# Patient Record
Sex: Female | Born: 1950 | Race: Black or African American | Hispanic: No | Marital: Married
Health system: Southern US, Community
[De-identification: ages and names within clinical notes are randomized; demographics above are authoritative.]

---

## 2004-02-10 ENCOUNTER — Other Ambulatory Visit: Payer: Self-pay

## 2006-10-08 ENCOUNTER — Other Ambulatory Visit: Payer: Self-pay

## 2006-10-08 ENCOUNTER — Emergency Department: Payer: Self-pay | Admitting: Emergency Medicine

## 2006-10-16 ENCOUNTER — Ambulatory Visit: Payer: Self-pay | Admitting: Family Medicine

## 2008-11-15 ENCOUNTER — Emergency Department: Payer: Self-pay | Admitting: Emergency Medicine

## 2009-05-03 ENCOUNTER — Ambulatory Visit: Payer: Self-pay | Admitting: Family Medicine

## 2009-06-02 ENCOUNTER — Ambulatory Visit: Payer: Self-pay | Admitting: General Surgery

## 2011-07-26 ENCOUNTER — Ambulatory Visit: Payer: Self-pay | Admitting: Internal Medicine

## 2011-08-09 ENCOUNTER — Emergency Department: Payer: Self-pay | Admitting: Emergency Medicine

## 2011-08-17 ENCOUNTER — Ambulatory Visit: Payer: Self-pay | Admitting: Family Medicine

## 2011-08-21 ENCOUNTER — Ambulatory Visit: Payer: Self-pay | Admitting: Internal Medicine

## 2011-08-24 ENCOUNTER — Ambulatory Visit: Payer: Self-pay | Admitting: Internal Medicine

## 2011-08-26 ENCOUNTER — Ambulatory Visit: Payer: Self-pay | Admitting: Internal Medicine

## 2011-08-30 ENCOUNTER — Ambulatory Visit: Payer: Self-pay | Admitting: Vascular Surgery

## 2011-09-25 ENCOUNTER — Ambulatory Visit: Payer: Self-pay | Admitting: Internal Medicine

## 2011-10-13 LAB — CANCER ANTIGEN 19-9: CA 19-9: 160 U/mL — ABNORMAL HIGH (ref 0–35)

## 2011-10-26 ENCOUNTER — Ambulatory Visit: Payer: Self-pay | Admitting: Internal Medicine

## 2011-10-27 LAB — CANCER ANTIGEN 19-9: CA 19-9: 145 U/mL — ABNORMAL HIGH (ref 0–35)

## 2011-11-11 LAB — CANCER ANTIGEN 19-9: CA 19-9: 120 U/mL — ABNORMAL HIGH (ref 0–35)

## 2011-11-25 ENCOUNTER — Ambulatory Visit: Payer: Self-pay | Admitting: Internal Medicine

## 2011-12-08 ENCOUNTER — Inpatient Hospital Stay: Payer: Self-pay | Admitting: Internal Medicine

## 2011-12-26 ENCOUNTER — Ambulatory Visit: Payer: Self-pay | Admitting: Internal Medicine

## 2012-01-09 LAB — COMPREHENSIVE METABOLIC PANEL
Alkaline Phosphatase: 78 U/L (ref 50–136)
Anion Gap: 5 — ABNORMAL LOW (ref 7–16)
Bilirubin,Total: 0.5 mg/dL (ref 0.2–1.0)
Chloride: 102 mmol/L (ref 98–107)
Co2: 32 mmol/L (ref 21–32)
Creatinine: 0.72 mg/dL (ref 0.60–1.30)
EGFR (African American): >60
EGFR (Non-African Amer.): >60
Osmolality: 274 (ref 275–301)
Potassium: 3.6 mmol/L (ref 3.5–5.1)
Sodium: 139 mmol/L (ref 136–145)

## 2012-01-09 LAB — CBC CANCER CENTER
Basophil #: 0.1 x10 3/mm (ref 0.0–0.1)
Basophil %: 0.5 %
Eosinophil #: 0.7 x10 3/mm (ref 0.0–0.7)
Eosinophil %: 6.2 %
HGB: 10.8 g/dL — ABNORMAL LOW (ref 12.0–16.0)
Lymphocyte %: 16.5 %
MCH: 29.4 pg (ref 26.0–34.0)
MCV: 92 fL (ref 80–100)
Monocyte #: 1.4 x10 3/mm — ABNORMAL HIGH (ref 0.0–0.7)
Monocyte %: 13.1 %
Neutrophil #: 6.8 x10 3/mm — ABNORMAL HIGH (ref 1.4–6.5)
Neutrophil %: 63.7 %
RBC: 3.66 10*6/uL — ABNORMAL LOW (ref 3.80–5.20)

## 2012-01-23 LAB — CBC CANCER CENTER
Basophil #: 0 x10 3/mm (ref 0.0–0.1)
Basophil %: 0.3 %
Eosinophil #: 0.2 x10 3/mm (ref 0.0–0.7)
HCT: 30.5 % — ABNORMAL LOW (ref 35.0–47.0)
HGB: 10.3 g/dL — ABNORMAL LOW (ref 12.0–16.0)
Lymphocyte #: 2.1 x10 3/mm (ref 1.0–3.6)
Lymphocyte %: 19.1 %
MCH: 29.8 pg (ref 26.0–34.0)
MCHC: 33.8 g/dL (ref 32.0–36.0)
MCV: 88 fL (ref 80–100)
Monocyte %: 6 %
Neutrophil #: 8.1 x10 3/mm — ABNORMAL HIGH (ref 1.4–6.5)
RBC: 3.46 10*6/uL — ABNORMAL LOW (ref 3.80–5.20)
RDW: 15.7 % — ABNORMAL HIGH (ref 11.5–14.5)
WBC: 11.1 x10 3/mm — ABNORMAL HIGH (ref 3.6–11.0)

## 2012-01-23 LAB — BASIC METABOLIC PANEL
Anion Gap: 9 (ref 7–16)
BUN: 5 mg/dL — ABNORMAL LOW (ref 7–18)
Calcium, Total: 8.8 mg/dL (ref 8.5–10.1)
Co2: 30 mmol/L (ref 21–32)
EGFR (Non-African Amer.): >60
Glucose: 104 mg/dL — ABNORMAL HIGH (ref 65–99)
Osmolality: 279 (ref 275–301)
Potassium: 2.4 mmol/L — CL (ref 3.5–5.1)
Sodium: 141 mmol/L (ref 136–145)

## 2012-01-23 LAB — HEPATIC FUNCTION PANEL A (ARMC)
Albumin: 2.9 g/dL — ABNORMAL LOW (ref 3.4–5.0)
Alkaline Phosphatase: 111 U/L (ref 50–136)
Bilirubin, Direct: 0.1 mg/dL (ref 0.00–0.20)
SGOT(AST): 24 U/L (ref 15–37)
Total Protein: 6.8 g/dL (ref 6.4–8.2)

## 2012-01-26 ENCOUNTER — Ambulatory Visit: Payer: Self-pay | Admitting: Internal Medicine

## 2012-01-30 LAB — CBC CANCER CENTER
Basophil #: 0 x10 3/mm (ref 0.0–0.1)
Basophil %: 0.4 %
Eosinophil #: 0 x10 3/mm (ref 0.0–0.7)
Eosinophil %: 0.7 %
HCT: 29.8 % — ABNORMAL LOW (ref 35.0–47.0)
HGB: 10 g/dL — ABNORMAL LOW (ref 12.0–16.0)
Lymphocyte #: 1.6 x10 3/mm (ref 1.0–3.6)
MCV: 88 fL (ref 80–100)
Monocyte #: 0.1 x10 3/mm (ref 0.0–0.7)
Neutrophil #: 4.8 x10 3/mm (ref 1.4–6.5)
Platelet: 148 x10 3/mm — ABNORMAL LOW (ref 150–440)
RBC: 3.4 10*6/uL — ABNORMAL LOW (ref 3.80–5.20)
WBC: 6.6 x10 3/mm (ref 3.6–11.0)

## 2012-02-06 LAB — COMPREHENSIVE METABOLIC PANEL
Albumin: 2.9 g/dL — ABNORMAL LOW (ref 3.4–5.0)
Alkaline Phosphatase: 97 U/L (ref 50–136)
BUN: 5 mg/dL — ABNORMAL LOW (ref 7–18)
Bilirubin,Total: 0.3 mg/dL (ref 0.2–1.0)
Co2: 29 mmol/L (ref 21–32)
Creatinine: 0.57 mg/dL — ABNORMAL LOW (ref 0.60–1.30)
EGFR (African American): >60
EGFR (Non-African Amer.): >60
Glucose: 124 mg/dL — ABNORMAL HIGH (ref 65–99)
SGOT(AST): 38 U/L — ABNORMAL HIGH (ref 15–37)
SGPT (ALT): 30 U/L
Sodium: 142 mmol/L (ref 136–145)
Total Protein: 6.6 g/dL (ref 6.4–8.2)

## 2012-02-06 LAB — CBC CANCER CENTER
Basophil #: 0 x10 3/mm (ref 0.0–0.1)
Basophil %: 0.3 %
Eosinophil %: 1.2 %
HCT: 30.2 % — ABNORMAL LOW (ref 35.0–47.0)
Lymphocyte #: 1.6 x10 3/mm (ref 1.0–3.6)
Lymphocyte %: 13.6 %
MCHC: 33.5 g/dL (ref 32.0–36.0)
MCV: 88 fL (ref 80–100)
Monocyte #: 0.7 x10 3/mm (ref 0.0–0.7)
Monocyte %: 5.7 %
Neutrophil #: 9.2 x10 3/mm — ABNORMAL HIGH (ref 1.4–6.5)
Platelet: 209 x10 3/mm (ref 150–440)
RBC: 3.42 10*6/uL — ABNORMAL LOW (ref 3.80–5.20)
RDW: 16.5 % — ABNORMAL HIGH (ref 11.5–14.5)

## 2012-02-13 LAB — CBC CANCER CENTER
Basophil %: 0.1 %
Eosinophil %: 0.7 %
HCT: 30.1 % — ABNORMAL LOW (ref 35.0–47.0)
Lymphocyte #: 1.4 x10 3/mm (ref 1.0–3.6)
MCHC: 33 g/dL (ref 32.0–36.0)
Monocyte #: 0 x10 3/mm (ref 0.0–0.7)
Platelet: 89 x10 3/mm — ABNORMAL LOW (ref 150–440)
RBC: 3.41 10*6/uL — ABNORMAL LOW (ref 3.80–5.20)
RDW: 16.4 % — ABNORMAL HIGH (ref 11.5–14.5)
WBC: 6.6 x10 3/mm (ref 3.6–11.0)

## 2012-02-13 LAB — BASIC METABOLIC PANEL
Anion Gap: 11 (ref 7–16)
Calcium, Total: 8.7 mg/dL (ref 8.5–10.1)
Co2: 27 mmol/L (ref 21–32)
Creatinine: 0.64 mg/dL (ref 0.60–1.30)
EGFR (African American): >60
EGFR (Non-African Amer.): >60
Glucose: 135 mg/dL — ABNORMAL HIGH (ref 65–99)
Osmolality: 277 (ref 275–301)
Sodium: 139 mmol/L (ref 136–145)

## 2012-02-21 LAB — CBC CANCER CENTER
Basophil #: 0 x10 3/mm (ref 0.0–0.1)
Basophil %: 0.2 %
Eosinophil #: 0.2 x10 3/mm (ref 0.0–0.7)
HCT: 29.6 % — ABNORMAL LOW (ref 35.0–47.0)
HGB: 9.9 g/dL — ABNORMAL LOW (ref 12.0–16.0)
Lymphocyte #: 2.1 x10 3/mm (ref 1.0–3.6)
Lymphocyte %: 13 %
MCH: 29.3 pg (ref 26.0–34.0)
MCHC: 33.4 g/dL (ref 32.0–36.0)
Monocyte #: 1.2 x10 3/mm — ABNORMAL HIGH (ref 0.0–0.7)
Neutrophil #: 12.8 x10 3/mm — ABNORMAL HIGH (ref 1.4–6.5)
RDW: 19 % — ABNORMAL HIGH (ref 11.5–14.5)

## 2012-02-21 LAB — COMPREHENSIVE METABOLIC PANEL
Albumin: 3 g/dL — ABNORMAL LOW (ref 3.4–5.0)
Anion Gap: 11 (ref 7–16)
BUN: 7 mg/dL (ref 7–18)
Calcium, Total: 8.8 mg/dL (ref 8.5–10.1)
Glucose: 95 mg/dL (ref 65–99)
Osmolality: 283 (ref 275–301)
SGOT(AST): 57 U/L — ABNORMAL HIGH (ref 15–37)
SGPT (ALT): 41 U/L
Total Protein: 6.6 g/dL (ref 6.4–8.2)

## 2012-02-21 LAB — MAGNESIUM: Magnesium: 1.6 mg/dL — ABNORMAL LOW

## 2012-02-23 ENCOUNTER — Ambulatory Visit: Payer: Self-pay | Admitting: Internal Medicine

## 2012-02-26 LAB — HEPATIC FUNCTION PANEL A (ARMC)
Albumin: 2.9 g/dL — ABNORMAL LOW (ref 3.4–5.0)
Alkaline Phosphatase: 120 U/L (ref 50–136)
Bilirubin, Direct: 0.1 mg/dL (ref 0.00–0.20)
Bilirubin,Total: 0.6 mg/dL (ref 0.2–1.0)

## 2012-02-26 LAB — CBC CANCER CENTER
Basophil #: 0 x10 3/mm (ref 0.0–0.1)
Basophil %: 0.3 %
Eosinophil #: 0.2 x10 3/mm (ref 0.0–0.7)
HCT: 27.9 % — ABNORMAL LOW (ref 35.0–47.0)
Lymphocyte %: 14 %
MCHC: 33.9 g/dL (ref 32.0–36.0)
Monocyte %: 9 %
Neutrophil #: 9.3 x10 3/mm — ABNORMAL HIGH (ref 1.4–6.5)
Neutrophil %: 75.1 %
Platelet: 244 x10 3/mm (ref 150–440)
WBC: 12.4 x10 3/mm — ABNORMAL HIGH (ref 3.6–11.0)

## 2012-02-26 LAB — MAGNESIUM: Magnesium: 1.7 mg/dL — ABNORMAL LOW

## 2012-02-26 LAB — CREATININE, SERUM: EGFR (Non-African Amer.): >60

## 2012-02-27 LAB — CANCER ANTIGEN 19-9: CA 19-9: 670 U/mL — ABNORMAL HIGH (ref 0–35)

## 2012-02-29 LAB — CBC CANCER CENTER
Basophil #: 0.1 x10 3/mm (ref 0.0–0.1)
Eosinophil #: 0.2 x10 3/mm (ref 0.0–0.7)
Eosinophil %: 1.1 %
HCT: 29.7 % — ABNORMAL LOW (ref 35.0–47.0)
Lymphocyte #: 1.7 x10 3/mm (ref 1.0–3.6)
MCV: 89 fL (ref 80–100)
Monocyte #: 1.3 x10 3/mm — ABNORMAL HIGH (ref 0.0–0.7)
Monocyte %: 10.1 %
Platelet: 301 x10 3/mm (ref 150–440)
RDW: 20.4 % — ABNORMAL HIGH (ref 11.5–14.5)
WBC: 13.3 x10 3/mm — ABNORMAL HIGH (ref 3.6–11.0)

## 2012-02-29 LAB — POTASSIUM: Potassium: 3 mmol/L — ABNORMAL LOW (ref 3.5–5.1)

## 2012-02-29 LAB — MAGNESIUM: Magnesium: 1.9 mg/dL

## 2012-02-29 LAB — SGOT (AST)(ARMC): SGOT(AST): 35 U/L (ref 15–37)

## 2012-02-29 LAB — CREATININE, SERUM: Creatinine: 0.77 mg/dL (ref 0.60–1.30)

## 2012-03-04 LAB — CREATININE, SERUM
Creatinine: 0.67 mg/dL (ref 0.60–1.30)
EGFR (African American): >60
EGFR (Non-African Amer.): >60

## 2012-03-11 LAB — COMPREHENSIVE METABOLIC PANEL
Albumin: 2.5 g/dL — ABNORMAL LOW (ref 3.4–5.0)
Alkaline Phosphatase: 166 U/L — ABNORMAL HIGH (ref 50–136)
Anion Gap: 10 (ref 7–16)
BUN: 8 mg/dL (ref 7–18)
Bilirubin,Total: 1.1 mg/dL — ABNORMAL HIGH (ref 0.2–1.0)
Calcium, Total: 9 mg/dL (ref 8.5–10.1)
Chloride: 93 mmol/L — ABNORMAL LOW (ref 98–107)
Creatinine: 0.77 mg/dL (ref 0.60–1.30)
EGFR (African American): >60
Glucose: 120 mg/dL — ABNORMAL HIGH (ref 65–99)
Potassium: 4.2 mmol/L (ref 3.5–5.1)
SGOT(AST): 41 U/L — ABNORMAL HIGH (ref 15–37)
SGPT (ALT): 44 U/L
Total Protein: 7.8 g/dL (ref 6.4–8.2)

## 2012-03-11 LAB — CBC CANCER CENTER
Basophil #: 0 x10 3/mm (ref 0.0–0.1)
Eosinophil #: 0.9 x10 3/mm — ABNORMAL HIGH (ref 0.0–0.7)
Lymphocyte %: 5.8 %
MCH: 29.1 pg (ref 26.0–34.0)
MCHC: 33.4 g/dL (ref 32.0–36.0)
MCV: 87 fL (ref 80–100)
Monocyte #: 3 x10 3/mm — ABNORMAL HIGH (ref 0.0–0.7)
Neutrophil %: 77.1 %
Platelet: 496 x10 3/mm — ABNORMAL HIGH (ref 150–440)
RDW: 18.8 % — ABNORMAL HIGH (ref 11.5–14.5)

## 2012-03-11 LAB — RETICULOCYTES
Absolute Retic Count: 0.071 10*6/uL (ref 0.024–0.084)
Reticulocyte: 2.4 % — ABNORMAL HIGH (ref 0.5–1.5)

## 2012-03-11 LAB — BILIRUBIN, DIRECT: Bilirubin, Direct: 0.4 mg/dL — ABNORMAL HIGH (ref 0.00–0.20)

## 2012-03-13 ENCOUNTER — Inpatient Hospital Stay: Payer: Self-pay | Admitting: Internal Medicine

## 2012-03-13 LAB — CBC CANCER CENTER
Basophil %: 0.2 %
Eosinophil #: 0.5 x10 3/mm (ref 0.0–0.7)
Eosinophil %: 1.6 %
Eosinophil: 2 %
HCT: 24.2 % — ABNORMAL LOW (ref 35.0–47.0)
HGB: 8.2 g/dL — ABNORMAL LOW (ref 12.0–16.0)
Lymphocyte #: 0.8 x10 3/mm — ABNORMAL LOW (ref 1.0–3.6)
MCH: 29.7 pg (ref 26.0–34.0)
MCHC: 33.9 g/dL (ref 32.0–36.0)
MCV: 88 fL (ref 80–100)
Monocyte #: 2.6 x10 3/mm — ABNORMAL HIGH (ref 0.0–0.7)
Neutrophil #: 25 x10 3/mm — ABNORMAL HIGH (ref 1.4–6.5)
RBC: 2.77 10*6/uL — ABNORMAL LOW (ref 3.80–5.20)
Segmented Neutrophils: 78 %

## 2012-03-13 LAB — CREATININE, SERUM
Creatinine: 0.99 mg/dL (ref 0.60–1.30)
EGFR (African American): >60
EGFR (Non-African Amer.): >60

## 2012-03-13 LAB — URINALYSIS, COMPLETE
Bacteria: NONE SEEN
Bilirubin,UR: NEGATIVE
Bilirubin,UR: NEGATIVE
Blood: NEGATIVE
Blood: NEGATIVE
Glucose,UR: NEGATIVE mg/dL (ref 0–75)
Ketone: NEGATIVE
Ketone: NEGATIVE
Nitrite: NEGATIVE
Ph: 5 (ref 4.5–8.0)
Protein: 30
RBC,UR: 1 /HPF (ref 0–5)
Specific Gravity: 1.023 (ref 1.003–1.030)
Squamous Epithelial: 4
WBC UR: 3 /HPF (ref 0–5)
WBC UR: 2 /HPF (ref 0–5)

## 2012-03-13 LAB — LACTATE DEHYDROGENASE: LDH: 186 U/L (ref 84–246)

## 2012-03-13 LAB — RETICULOCYTES: Absolute Retic Count: 0.071 10*6/uL (ref 0.024–0.084)

## 2012-03-13 LAB — HEMOGLOBIN
HGB: 7.8 g/dL — ABNORMAL LOW (ref 12.0–16.0)
HGB: 8.1 g/dL — ABNORMAL LOW (ref 12.0–16.0)

## 2012-03-13 LAB — HEPATIC FUNCTION PANEL A (ARMC): Bilirubin, Direct: 0.5 mg/dL — ABNORMAL HIGH (ref 0.00–0.20)

## 2012-03-14 LAB — CBC WITH DIFFERENTIAL/PLATELET
Basophil #: 0 10*3/uL (ref 0.0–0.1)
Eosinophil #: 0 10*3/uL (ref 0.0–0.7)
Eosinophil %: 0 %
HGB: 7.4 g/dL — ABNORMAL LOW (ref 12.0–16.0)
Lymphocyte #: 1 10*3/uL (ref 1.0–3.6)
MCH: 30.6 pg (ref 26.0–34.0)
Monocyte #: 1 10*3/uL — ABNORMAL HIGH (ref 0.0–0.7)
Neutrophil %: 91.8 %
Platelet: 369 10*3/uL (ref 150–440)
RBC: 2.41 10*6/uL — ABNORMAL LOW (ref 3.80–5.20)

## 2012-03-14 LAB — OCCULT BLOOD X 1 CARD TO LAB, STOOL: Occult Blood, Feces: NEGATIVE

## 2012-03-14 LAB — IRON AND TIBC
Iron Bind.Cap.(Total): 143 ug/dL — ABNORMAL LOW (ref 250–450)
Unbound Iron-Bind.Cap.: 98 ug/dL

## 2012-03-14 LAB — PROTIME-INR: INR: 1.3

## 2012-03-14 LAB — CREATININE, SERUM: EGFR (African American): >60

## 2012-03-14 LAB — BILIRUBIN, TOTAL: Bilirubin,Total: 0.6 mg/dL (ref 0.2–1.0)

## 2012-03-14 LAB — RETICULOCYTES
Absolute Retic Count: 0.066 10*6/uL (ref 0.024–0.084)
Reticulocyte: 2.7 % — ABNORMAL HIGH (ref 0.5–1.5)

## 2012-03-14 LAB — LACTATE DEHYDROGENASE: LDH: 178 U/L (ref 84–246)

## 2012-03-15 LAB — LACTATE DEHYDROGENASE: LDH: 197 U/L (ref 84–246)

## 2012-03-15 LAB — URINE CULTURE

## 2012-03-15 LAB — HEMOGLOBIN: HGB: 7.9 g/dL — ABNORMAL LOW (ref 12.0–16.0)

## 2012-03-15 LAB — RETICULOCYTES: Reticulocyte: 3.6 % — ABNORMAL HIGH (ref 0.5–1.5)

## 2012-03-16 LAB — CBC WITH DIFFERENTIAL/PLATELET
Bands: 3 %
Basophil #: 0 10*3/uL (ref 0.0–0.1)
Eosinophil #: 0.3 10*3/uL (ref 0.0–0.7)
Eosinophil %: 0.7 %
HCT: 23.5 % — ABNORMAL LOW (ref 35.0–47.0)
HGB: 7.9 g/dL — ABNORMAL LOW (ref 12.0–16.0)
Lymphocyte #: 1.1 10*3/uL (ref 1.0–3.6)
Lymphocyte %: 2.8 %
MCHC: 33.7 g/dL (ref 32.0–36.0)
MCV: 87 fL (ref 80–100)
Monocyte #: 3.2 10*3/uL — ABNORMAL HIGH (ref 0.0–0.7)
Neutrophil %: 88.4 %
Platelet: 290 10*3/uL (ref 150–440)
RDW: 18.5 % — ABNORMAL HIGH (ref 11.5–14.5)
Segmented Neutrophils: 86 %
WBC: 40.1 10*3/uL — ABNORMAL HIGH (ref 3.6–11.0)

## 2012-03-16 LAB — RETICULOCYTES
Absolute Retic Count: 0.12 10*6/uL — ABNORMAL HIGH (ref 0.024–0.084)
Reticulocyte: 4.5 % — ABNORMAL HIGH (ref 0.5–1.5)

## 2012-03-16 LAB — LACTATE DEHYDROGENASE: LDH: 197 U/L (ref 84–246)

## 2012-03-17 ENCOUNTER — Inpatient Hospital Stay: Payer: Self-pay | Admitting: Internal Medicine

## 2012-03-17 LAB — URINALYSIS, COMPLETE
Bilirubin,UR: NEGATIVE
Ketone: NEGATIVE
Leukocyte Esterase: NEGATIVE
Nitrite: NEGATIVE
Ph: 5 (ref 4.5–8.0)
Specific Gravity: 1.015 (ref 1.003–1.030)
Squamous Epithelial: 7
WBC UR: 2 /HPF (ref 0–5)

## 2012-03-17 LAB — COMPREHENSIVE METABOLIC PANEL
Albumin: 2.1 g/dL — ABNORMAL LOW (ref 3.4–5.0)
BUN: 7 mg/dL (ref 7–18)
Bilirubin,Total: 0.9 mg/dL (ref 0.2–1.0)
Chloride: 100 mmol/L (ref 98–107)
Co2: 24 mmol/L (ref 21–32)
EGFR (Non-African Amer.): >60
Glucose: 101 mg/dL — ABNORMAL HIGH (ref 65–99)
SGPT (ALT): 27 U/L
Total Protein: 6.5 g/dL (ref 6.4–8.2)

## 2012-03-17 LAB — CBC
HCT: 22.9 % — ABNORMAL LOW (ref 35.0–47.0)
MCHC: 33.1 g/dL (ref 32.0–36.0)
MCV: 88 fL (ref 80–100)
Platelet: 192 10*3/uL (ref 150–440)
RDW: 18.9 % — ABNORMAL HIGH (ref 11.5–14.5)

## 2012-03-17 LAB — LIPASE, BLOOD: Lipase: 50 U/L — ABNORMAL LOW (ref 73–393)

## 2012-03-17 LAB — CK TOTAL AND CKMB (NOT AT ARMC): CK, Total: 11 U/L — ABNORMAL LOW (ref 21–215)

## 2012-03-18 LAB — COMPREHENSIVE METABOLIC PANEL
Albumin: 1.8 g/dL — ABNORMAL LOW (ref 3.4–5.0)
Alkaline Phosphatase: 91 U/L (ref 50–136)
Anion Gap: 12 (ref 7–16)
BUN: 7 mg/dL (ref 7–18)
Calcium, Total: 8 mg/dL — ABNORMAL LOW (ref 8.5–10.1)
Chloride: 101 mmol/L (ref 98–107)
Creatinine: 0.62 mg/dL (ref 0.60–1.30)
Glucose: 95 mg/dL (ref 65–99)
Potassium: 4 mmol/L (ref 3.5–5.1)
SGOT(AST): 24 U/L (ref 15–37)
SGPT (ALT): 22 U/L
Total Protein: 5.6 g/dL — ABNORMAL LOW (ref 6.4–8.2)

## 2012-03-18 LAB — CBC WITH DIFFERENTIAL/PLATELET
Basophil #: 0 10*3/uL (ref 0.0–0.1)
Basophil %: 0 %
Eosinophil #: 0.4 10*3/uL (ref 0.0–0.7)
Eosinophil %: 0.8 %
Eosinophil: 2 %
HCT: 20 % — ABNORMAL LOW (ref 35.0–47.0)
HGB: 6.8 g/dL — ABNORMAL LOW (ref 12.0–16.0)
Lymphocyte #: 1.9 10*3/uL (ref 1.0–3.6)
Lymphocyte %: 4.4 %
MCH: 31.1 pg (ref 26.0–34.0)
MCV: 91 fL (ref 80–100)
Neutrophil #: 39.5 10*3/uL — ABNORMAL HIGH (ref 1.4–6.5)
Neutrophil %: 89.9 %
Platelet: 159 10*3/uL (ref 150–440)
RDW: 19 % — ABNORMAL HIGH (ref 11.5–14.5)
WBC: 43.7 10*3/uL — ABNORMAL HIGH (ref 3.6–11.0)

## 2012-03-18 LAB — CREATININE, SERUM: Creatinine: 0.64 mg/dL (ref 0.60–1.30)

## 2012-03-19 LAB — VANCOMYCIN, TROUGH: Vancomycin, Trough: 15 ug/mL (ref 10–20)

## 2012-03-19 LAB — BASIC METABOLIC PANEL
Calcium, Total: 8.3 mg/dL — ABNORMAL LOW (ref 8.5–10.1)
Chloride: 102 mmol/L (ref 98–107)
Creatinine: 0.57 mg/dL — ABNORMAL LOW (ref 0.60–1.30)
EGFR (Non-African Amer.): >60
Glucose: 110 mg/dL — ABNORMAL HIGH (ref 65–99)
Potassium: 3.2 mmol/L — ABNORMAL LOW (ref 3.5–5.1)
Sodium: 138 mmol/L (ref 136–145)

## 2012-03-19 LAB — CBC WITH DIFFERENTIAL/PLATELET
Basophil %: 0 %
Eosinophil %: 0.1 %
HCT: 24.5 % — ABNORMAL LOW (ref 35.0–47.0)
HGB: 8.3 g/dL — ABNORMAL LOW (ref 12.0–16.0)
Lymphocyte %: 3.3 %
MCV: 89 fL (ref 80–100)
Monocyte %: 5.2 %
Neutrophil #: 41.1 10*3/uL — ABNORMAL HIGH (ref 1.4–6.5)
Neutrophil %: 91.4 %
Platelet: 165 10*3/uL (ref 150–440)
RDW: 17.1 % — ABNORMAL HIGH (ref 11.5–14.5)

## 2012-03-20 LAB — CBC WITH DIFFERENTIAL/PLATELET
Bands: 1 %
Basophil #: 0 10*3/uL (ref 0.0–0.1)
Basophil %: 0 %
Eosinophil %: 0.7 %
Eosinophil: 1 %
HGB: 9.1 g/dL — ABNORMAL LOW (ref 12.0–16.0)
Lymphocyte #: 0.9 10*3/uL — ABNORMAL LOW (ref 1.0–3.6)
Lymphocyte %: 1.8 %
Lymphocytes: 1 %
MCHC: 35 g/dL (ref 32.0–36.0)
Metamyelocyte: 1 %
Monocyte #: 2.6 10*3/uL — ABNORMAL HIGH (ref 0.0–0.7)
Monocyte %: 5.2 %
Monocytes: 4 %
Neutrophil %: 92.3 %
RBC: 2.8 10*6/uL — ABNORMAL LOW (ref 3.80–5.20)
RDW: 17.8 % — ABNORMAL HIGH (ref 11.5–14.5)
Segmented Neutrophils: 92 %

## 2012-03-20 LAB — POTASSIUM: Potassium: 3.7 mmol/L (ref 3.5–5.1)

## 2012-03-20 LAB — CREATININE, SERUM
Creatinine: 0.8 mg/dL (ref 0.60–1.30)
EGFR (Non-African Amer.): >60

## 2012-03-23 LAB — CULTURE, BLOOD (SINGLE)

## 2012-03-25 ENCOUNTER — Ambulatory Visit: Payer: Self-pay | Admitting: Internal Medicine

## 2012-03-25 ENCOUNTER — Inpatient Hospital Stay: Payer: Self-pay | Admitting: Internal Medicine

## 2012-03-25 LAB — COMPREHENSIVE METABOLIC PANEL
Albumin: 2.2 g/dL — ABNORMAL LOW (ref 3.4–5.0)
Alkaline Phosphatase: 115 U/L (ref 50–136)
Anion Gap: 14 (ref 7–16)
Co2: 25 mmol/L (ref 21–32)
Creatinine: 0.83 mg/dL (ref 0.60–1.30)
EGFR (Non-African Amer.): >60
Osmolality: 273 (ref 275–301)
Potassium: 3.5 mmol/L (ref 3.5–5.1)
SGOT(AST): 54 U/L — ABNORMAL HIGH (ref 15–37)
Sodium: 136 mmol/L (ref 136–145)
Total Protein: 6.9 g/dL (ref 6.4–8.2)

## 2012-03-25 LAB — URINALYSIS, COMPLETE
Ketone: NEGATIVE
Leukocyte Esterase: NEGATIVE
Nitrite: NEGATIVE
Protein: 30
RBC,UR: NONE SEEN /HPF (ref 0–5)
Specific Gravity: 1.014 (ref 1.003–1.030)
Squamous Epithelial: <1
WBC UR: 3 /HPF (ref 0–5)

## 2012-03-25 LAB — LIPASE, BLOOD: Lipase: 38 U/L — ABNORMAL LOW (ref 73–393)

## 2012-03-25 LAB — TROPONIN I
Troponin-I: 0.38 ng/mL — ABNORMAL HIGH
Troponin-I: 0.47 ng/mL — ABNORMAL HIGH

## 2012-03-25 LAB — CBC
MCH: 29.6 pg (ref 26.0–34.0)
MCV: 88 fL (ref 80–100)
RDW: 18.1 % — ABNORMAL HIGH (ref 11.5–14.5)
WBC: 72.4 10*3/uL — ABNORMAL HIGH (ref 3.6–11.0)

## 2012-03-25 LAB — CK TOTAL AND CKMB (NOT AT ARMC)
CK, Total: 59 U/L (ref 21–215)
CK-MB: 1 ng/mL (ref 0.5–3.6)

## 2012-03-26 LAB — CK TOTAL AND CKMB (NOT AT ARMC)
CK, Total: 65 U/L (ref 21–215)
CK-MB: 0.8 ng/mL (ref 0.5–3.6)
CK-MB: 0.5 ng/mL (ref 0.5–3.6)

## 2012-03-26 LAB — CBC WITH DIFFERENTIAL/PLATELET
Comment - H1-Com4: NORMAL
MCH: 29.5 pg (ref 26.0–34.0)
Myelocyte: 1 %
RBC: 2.7 10*6/uL — ABNORMAL LOW (ref 3.80–5.20)
RDW: 18.4 % — ABNORMAL HIGH (ref 11.5–14.5)
WBC: 58.6 10*3/uL — ABNORMAL HIGH (ref 3.6–11.0)

## 2012-03-26 LAB — COMPREHENSIVE METABOLIC PANEL
Alkaline Phosphatase: 101 U/L (ref 50–136)
Anion Gap: 11 (ref 7–16)
BUN: 16 mg/dL (ref 7–18)
Bilirubin,Total: 1.1 mg/dL — ABNORMAL HIGH (ref 0.2–1.0)
Calcium, Total: 8.4 mg/dL — ABNORMAL LOW (ref 8.5–10.1)
Chloride: 100 mmol/L (ref 98–107)
EGFR (African American): >60
EGFR (Non-African Amer.): >60
Glucose: 109 mg/dL — ABNORMAL HIGH (ref 65–99)
Potassium: 3.8 mmol/L (ref 3.5–5.1)
SGPT (ALT): 17 U/L

## 2012-03-27 LAB — CBC WITH DIFFERENTIAL/PLATELET
Basophil #: 0 10*3/uL (ref 0.0–0.1)
Basophil %: 0 %
Eosinophil #: 0.1 10*3/uL (ref 0.0–0.7)
HGB: 8.4 g/dL — ABNORMAL LOW (ref 12.0–16.0)
Lymphocyte #: 1.3 10*3/uL (ref 1.0–3.6)
Lymphocyte %: 2 %
MCH: 29.1 pg (ref 26.0–34.0)
MCHC: 33.5 g/dL (ref 32.0–36.0)
MCV: 87 fL (ref 80–100)
Monocyte #: 1.8 10*3/uL — ABNORMAL HIGH (ref 0.0–0.7)
Neutrophil #: 59.8 10*3/uL — ABNORMAL HIGH (ref 1.4–6.5)
Neutrophil %: 95 %
Platelet: 74 10*3/uL — ABNORMAL LOW (ref 150–440)
RBC: 2.9 10*6/uL — ABNORMAL LOW (ref 3.80–5.20)
WBC: 64.4 10*3/uL — ABNORMAL HIGH (ref 3.6–11.0)

## 2012-03-27 LAB — COMPREHENSIVE METABOLIC PANEL
Albumin: 1.7 g/dL — ABNORMAL LOW (ref 3.4–5.0)
Alkaline Phosphatase: 100 U/L (ref 50–136)
Anion Gap: 12 (ref 7–16)
BUN: 16 mg/dL (ref 7–18)
Calcium, Total: 8.9 mg/dL (ref 8.5–10.1)
Chloride: 102 mmol/L (ref 98–107)
Co2: 25 mmol/L (ref 21–32)
Creatinine: 0.79 mg/dL (ref 0.60–1.30)
EGFR (African American): >60
EGFR (Non-African Amer.): >60
Glucose: 81 mg/dL (ref 65–99)
Osmolality: 278 (ref 275–301)
Potassium: 3.2 mmol/L — ABNORMAL LOW (ref 3.5–5.1)
SGOT(AST): 40 U/L — ABNORMAL HIGH (ref 15–37)
SGPT (ALT): 17 U/L
Total Protein: 5.9 g/dL — ABNORMAL LOW (ref 6.4–8.2)

## 2012-03-28 LAB — HEPATIC FUNCTION PANEL A (ARMC)
Albumin: 1.8 g/dL — ABNORMAL LOW (ref 3.4–5.0)
Alkaline Phosphatase: 102 U/L (ref 50–136)
Bilirubin, Direct: 0.5 mg/dL — ABNORMAL HIGH (ref 0.00–0.20)
SGOT(AST): 54 U/L — ABNORMAL HIGH (ref 15–37)
SGPT (ALT): 20 U/L
Total Protein: 6 g/dL — ABNORMAL LOW (ref 6.4–8.2)

## 2012-03-28 LAB — CBC WITH DIFFERENTIAL/PLATELET
Basophil %: 0 %
Eosinophil %: 0.3 %
HCT: 28.9 % — ABNORMAL LOW (ref 35.0–47.0)
Lymphocyte #: 1.5 10*3/uL (ref 1.0–3.6)
Lymphocyte %: 2.5 %
Monocyte #: 1.9 10*3/uL — ABNORMAL HIGH (ref 0.0–0.7)
Monocyte %: 3.1 %
RBC: 3.27 10*6/uL — ABNORMAL LOW (ref 3.80–5.20)

## 2012-03-28 LAB — CREATININE, SERUM: EGFR (African American): >60

## 2012-03-29 LAB — BASIC METABOLIC PANEL
BUN: 22 mg/dL — ABNORMAL HIGH (ref 7–18)
Chloride: 104 mmol/L (ref 98–107)
Co2: 26 mmol/L (ref 21–32)
Creatinine: 0.88 mg/dL (ref 0.60–1.30)
EGFR (African American): >60
EGFR (Non-African Amer.): >60
Glucose: 95 mg/dL (ref 65–99)
Osmolality: 283 (ref 275–301)
Potassium: 3.1 mmol/L — ABNORMAL LOW (ref 3.5–5.1)
Sodium: 140 mmol/L (ref 136–145)

## 2012-03-29 LAB — CBC WITH DIFFERENTIAL/PLATELET
Bands: 16 %
HCT: 29.8 % — ABNORMAL LOW (ref 35.0–47.0)
HGB: 10 g/dL — ABNORMAL LOW (ref 12.0–16.0)
MCH: 29.2 pg (ref 26.0–34.0)
MCV: 88 fL (ref 80–100)
Platelet: 27 10*3/uL — CL (ref 150–440)
Segmented Neutrophils: 80 %

## 2012-03-31 LAB — CULTURE, BLOOD (SINGLE)

## 2012-04-24 ENCOUNTER — Ambulatory Visit: Payer: Self-pay | Admitting: Internal Medicine

## 2012-04-24 DEATH — deceased

## 2013-02-27 IMAGING — CR DG CHEST 1V PORT
1 series · 1 of 1 positions shown · non-contrast
Comparison: none

REASON FOR EXAM: shortness of breath
COMMENTS:

[portable]
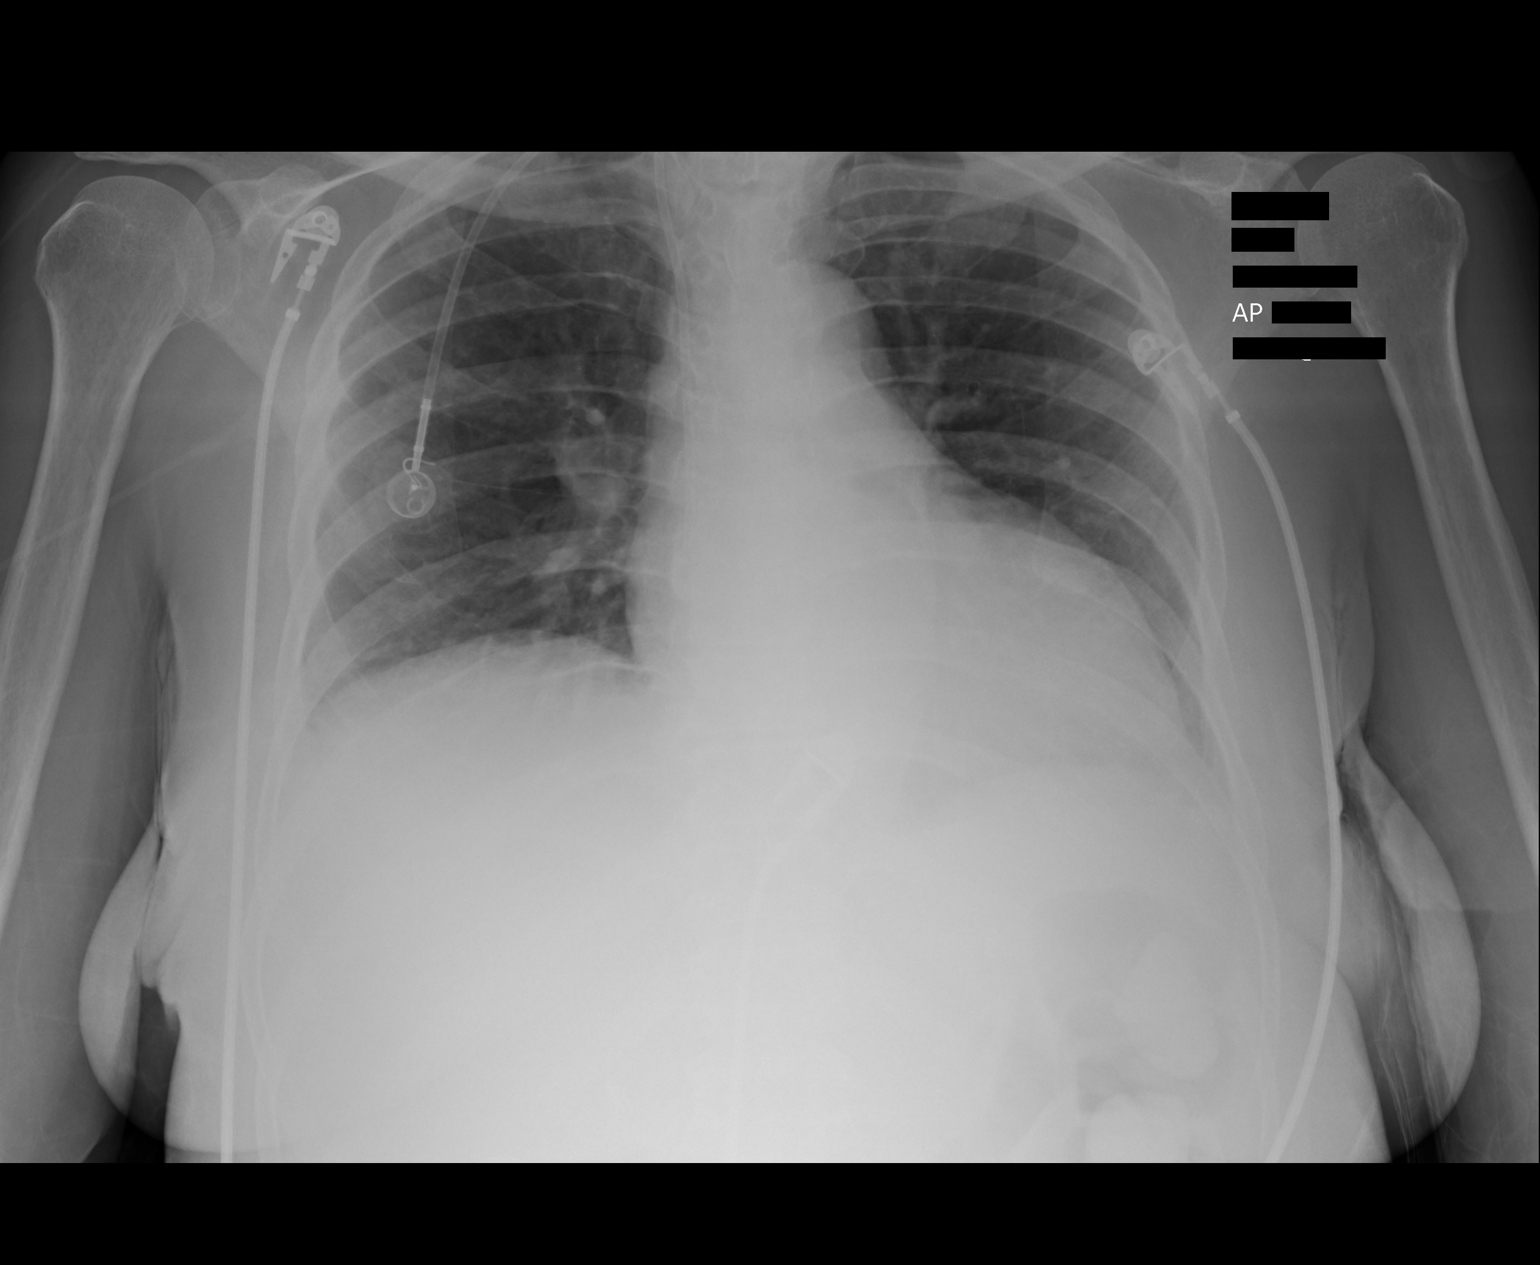

[1 of 1 positions shown; findings below may reference images not displayed]

PROCEDURE:     DXR - DXR PORTABLE CHEST SINGLE VIEW  - March 17, 2012  [DATE]

RESULT:     Portable AP view was obtained. The lung fields are clear of
infiltrate. No lung mass is seen. A Port-A-Cath is present. The heart is
upper limits for normal in size or mildly enlarged. Monitoring electrodes
are present.
IMPRESSION: 1. No acute changes are identified.
2. The heart is upper limits for normal in size or slightly enlarged.
3. A Port-A-Cath is present.

## 2013-02-28 IMAGING — CR DG CHEST 2V
1 series · 2 of 2 positions shown · non-contrast
Comparison: none

REASON FOR EXAM: fever
COMMENTS:

[Series 1: pa · 0.17mm/px · 2 of 2 slices shown]
[im 1/2]
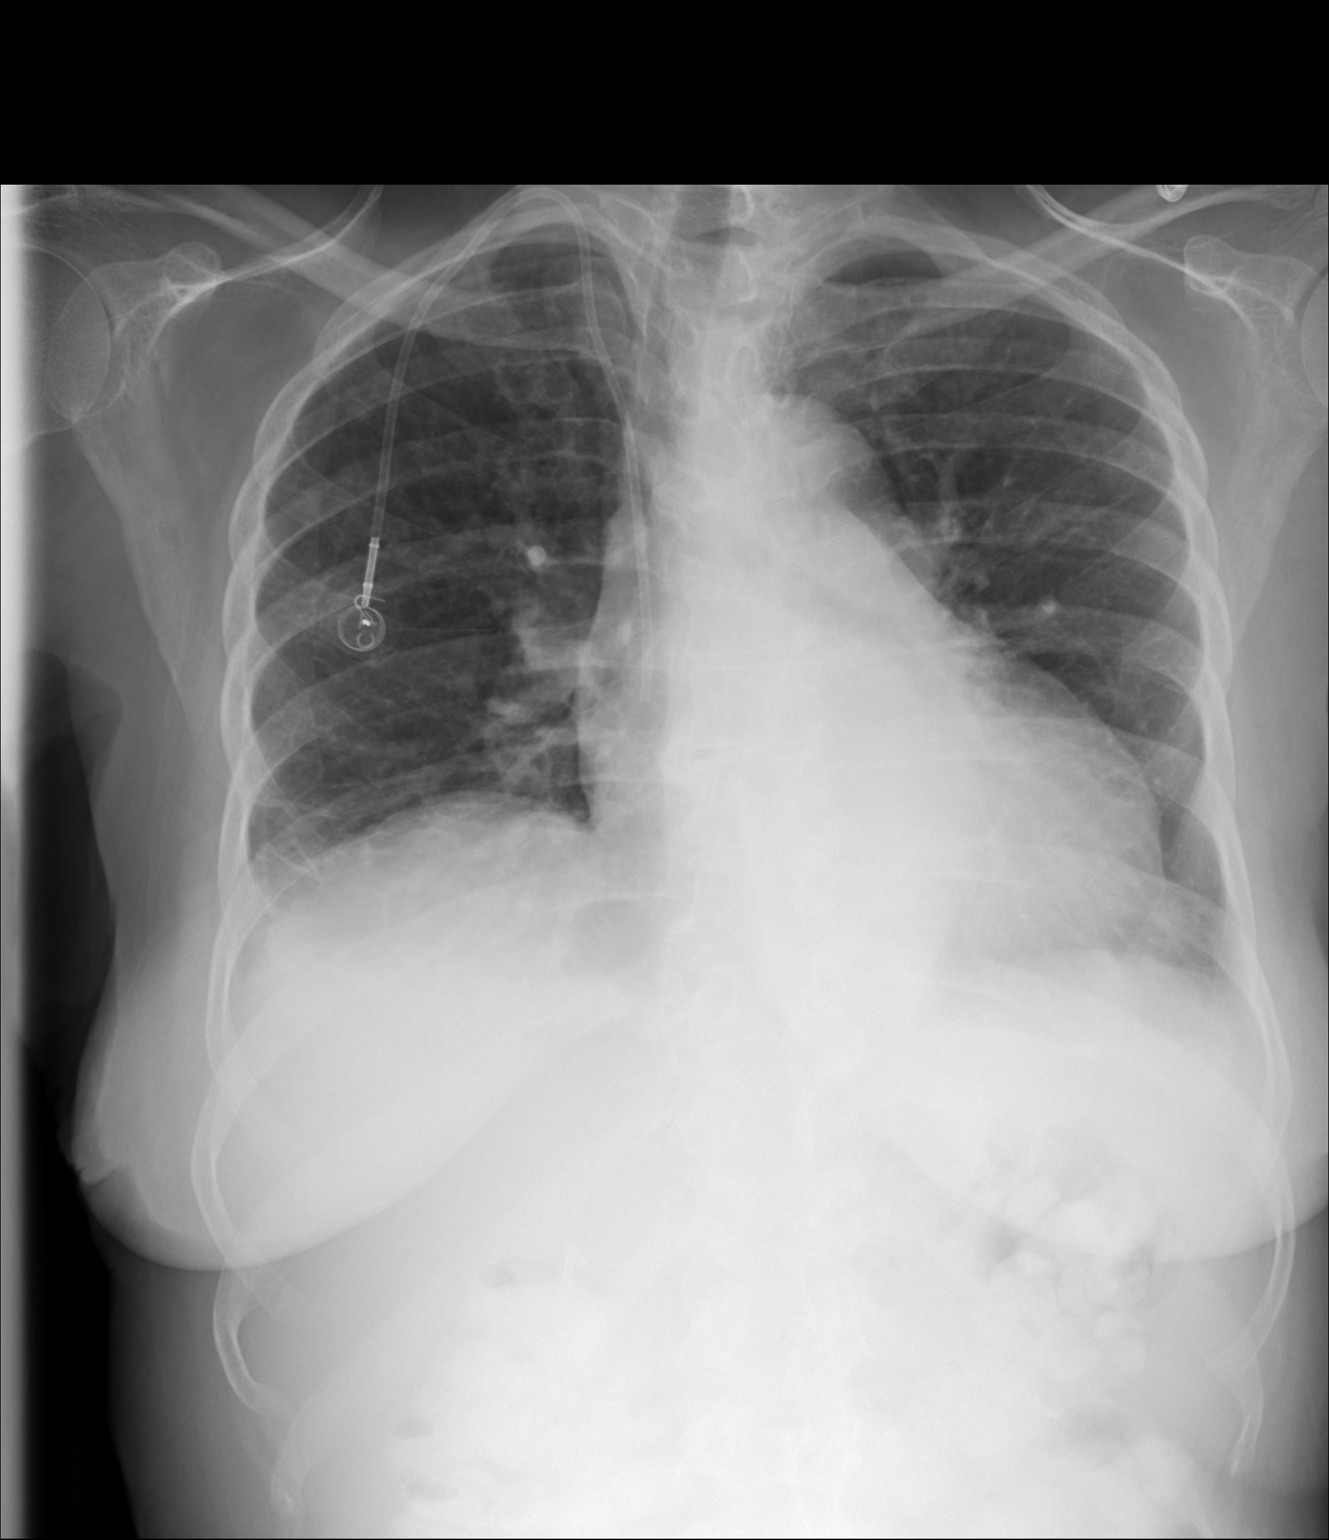
[im 2/2]
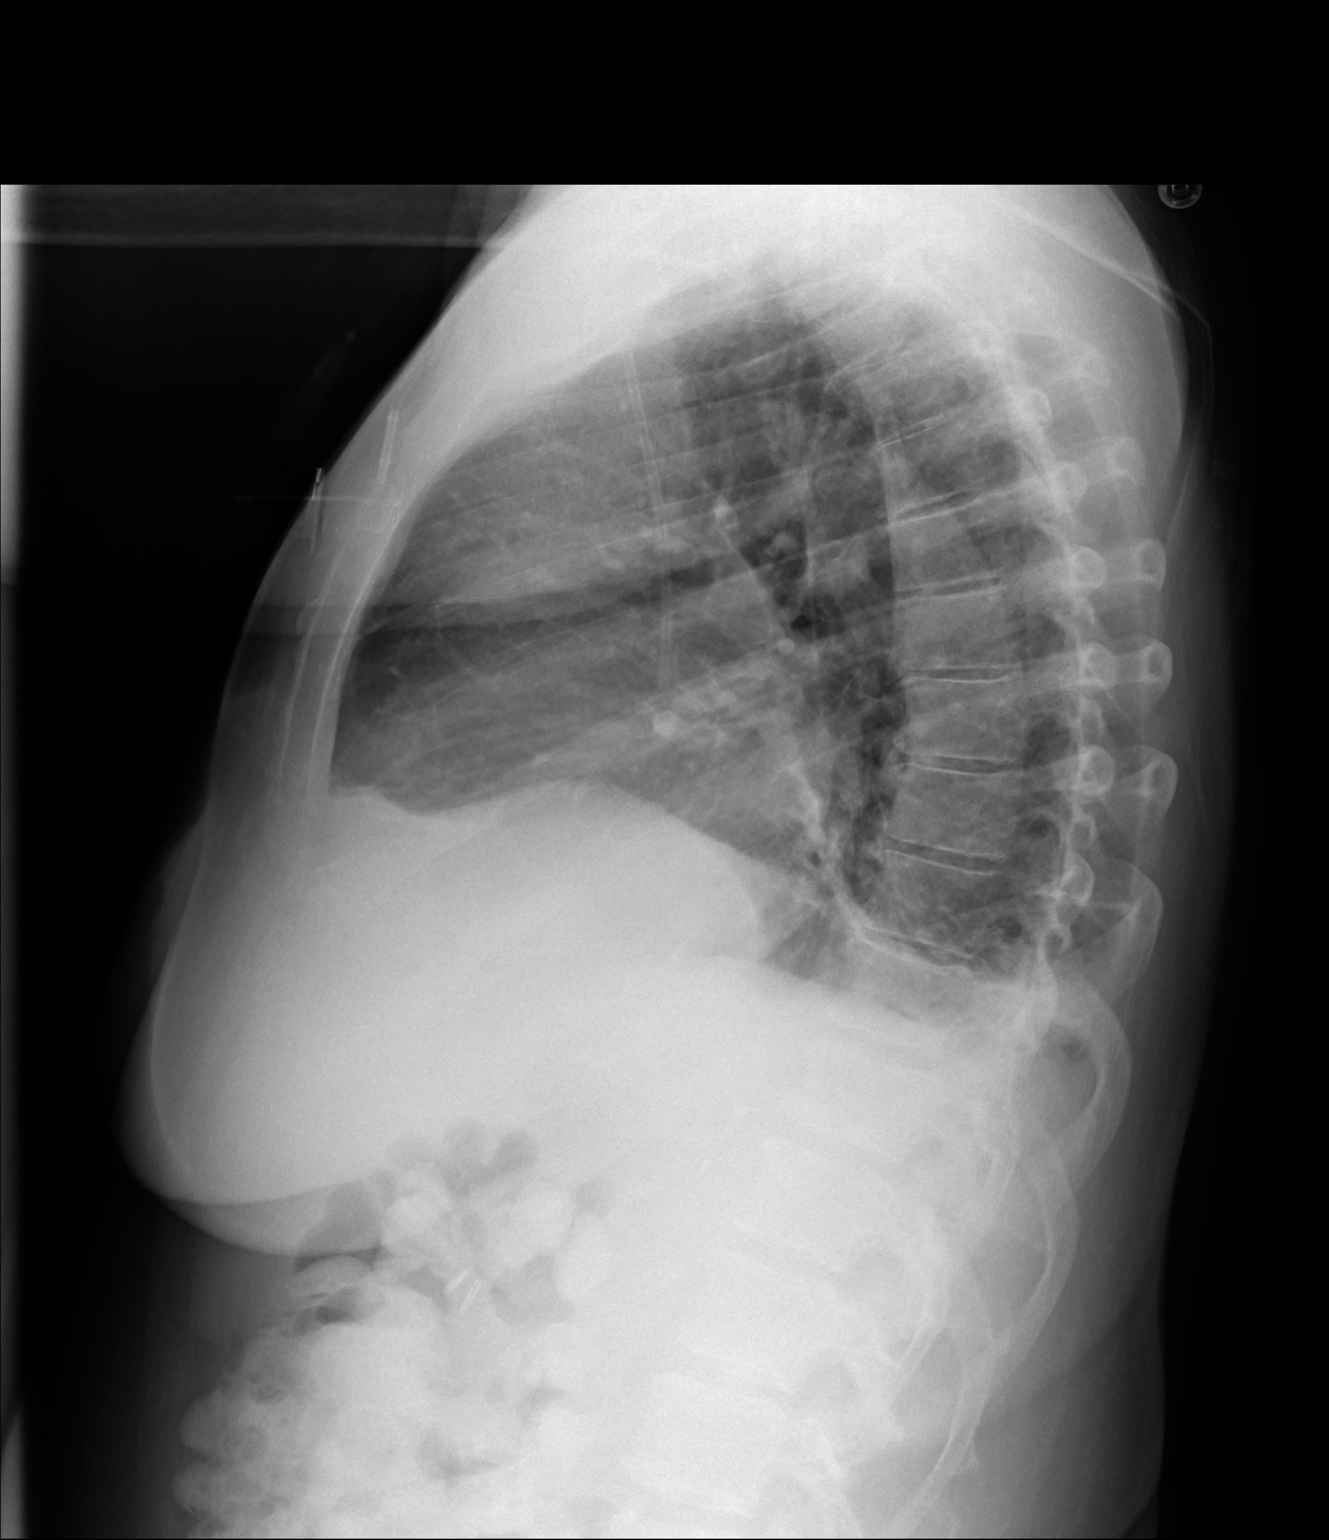

[2 of 2 positions shown; findings below may reference images not displayed]

PROCEDURE:     DXR - DXR CHEST PA (OR AP) AND LATERAL  - March 18, 2012  [DATE]

RESULT:     Comparison is made to the prior exam of 03/17/2012. A Port-A-Cath
is present with the tip projected over the right atrium. There is increased
density at the right costophrenic angle and right posterior gutter.
Pneumonia or trace effusion are both considerations in the differential at
this point. The lung fields otherwise are clear. The heart is mildly
enlarged but stable in size as compared to the prior exam.
IMPRESSION: 1.     There is increased density at the right costophrenic angle compatible
with a small infiltrate or a trace effusion.

## 2013-03-07 IMAGING — CT CT CHEST-ABD-PELV W/O CM
1 of 3 series · 13 of 29 positions shown, 18 images · non-contrast
Comparison: none

REASON FOR EXAM: (1) dyspnea  poor inisp on cxr  suspect pneumonia  NO
IV  no po contrast; (2) a
COMMENTS:

[Series 2: soft tissue · axial · 0.80mm/px · z∈[-630,-75]mm · 13 of 129 slices shown, 18 images]
[im 9/129  mediastinal]
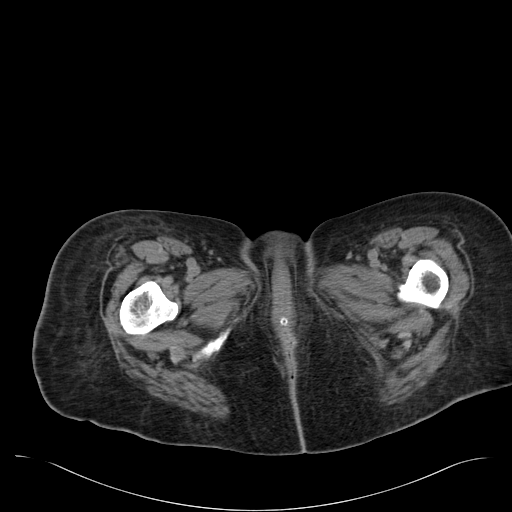
[im 9/129  bone]
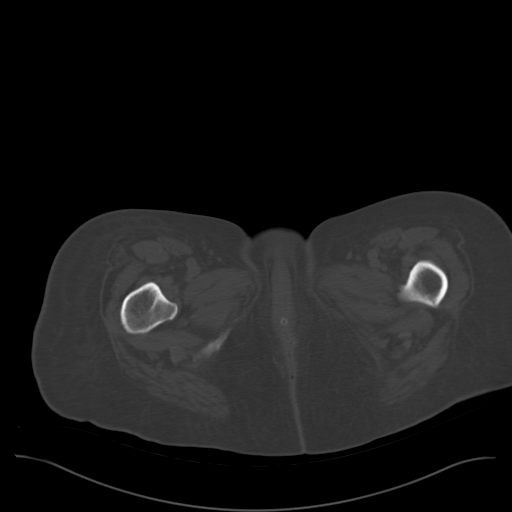
[im 18/129  mediastinal]
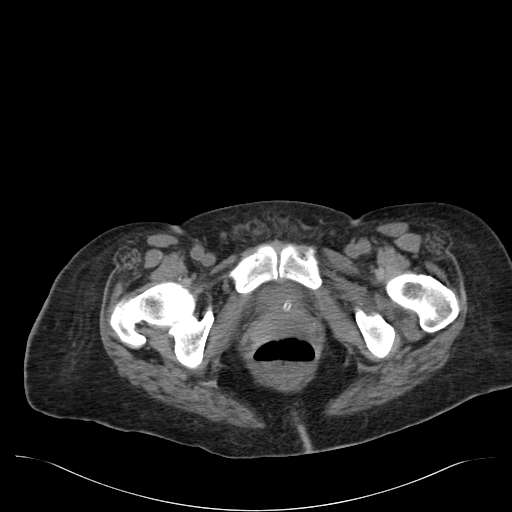
[im 35/129  mediastinal]
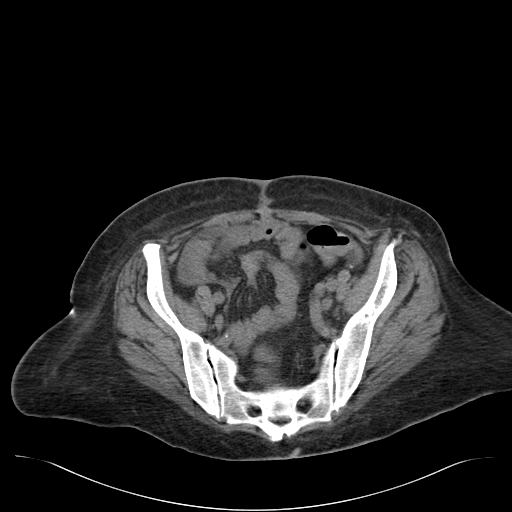
[im 43/129  mediastinal]
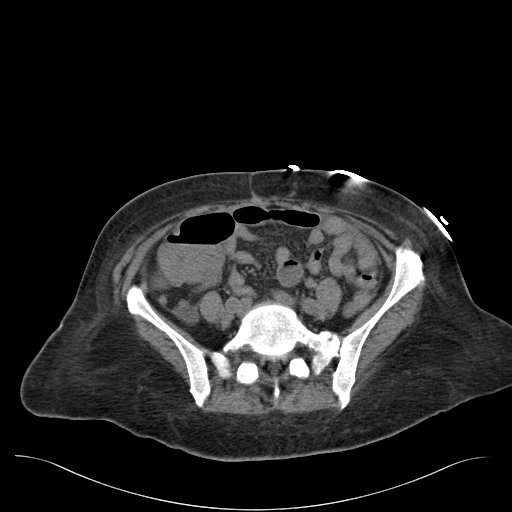
[im 52/129  mediastinal]
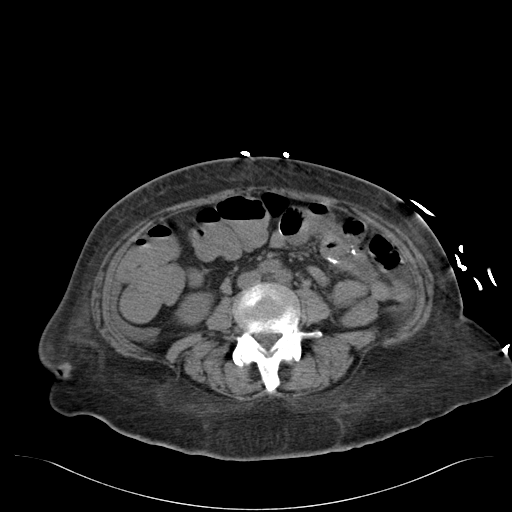
[im 60/129  mediastinal]
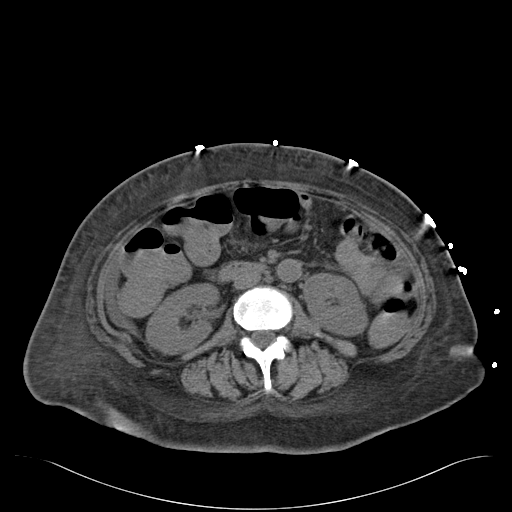
[im 69/129  mediastinal]
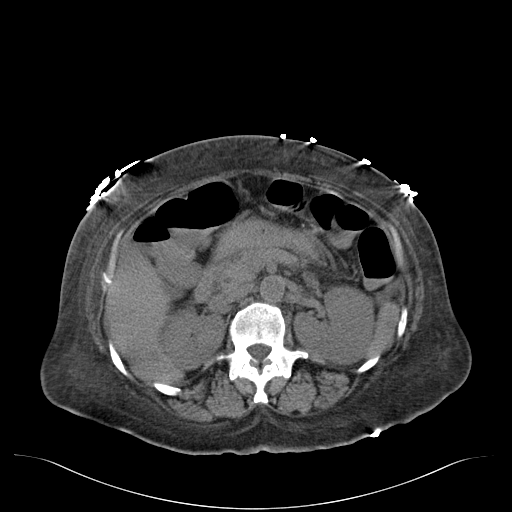
[im 77/129  mediastinal]
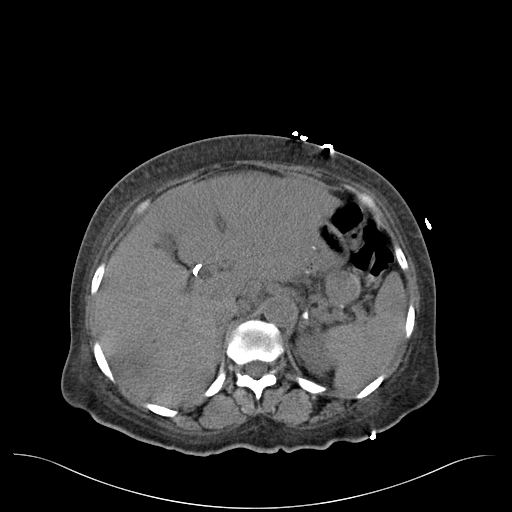
[im 86/129  mediastinal]
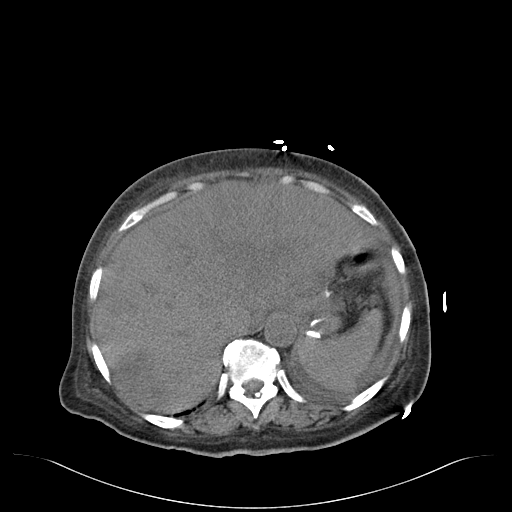
[im 86/129  bone]
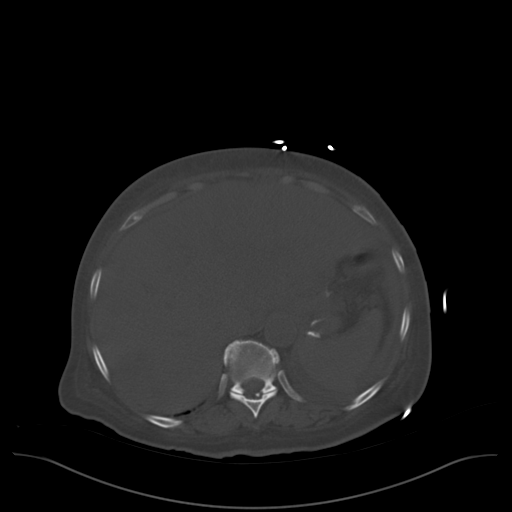
[im 94/129  mediastinal]
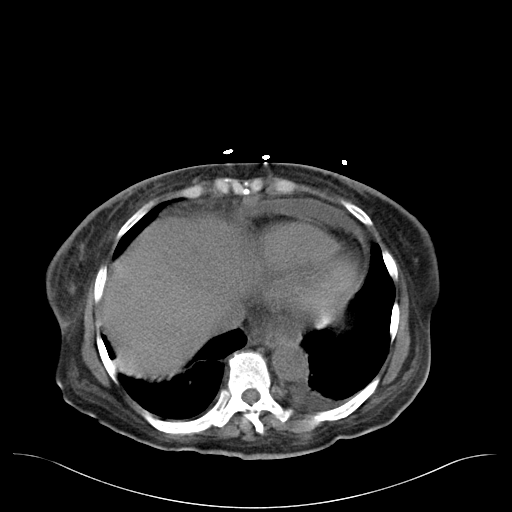
[im 94/129  lung]
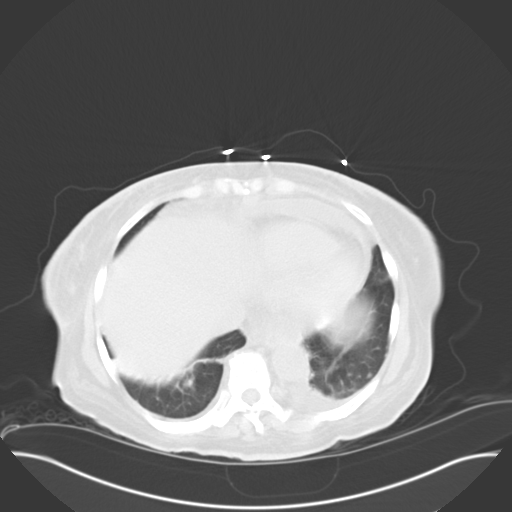
[im 103/129  lung]
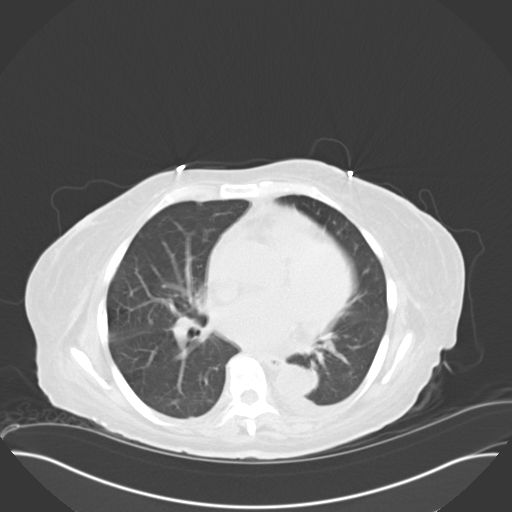
[im 111/129  mediastinal]
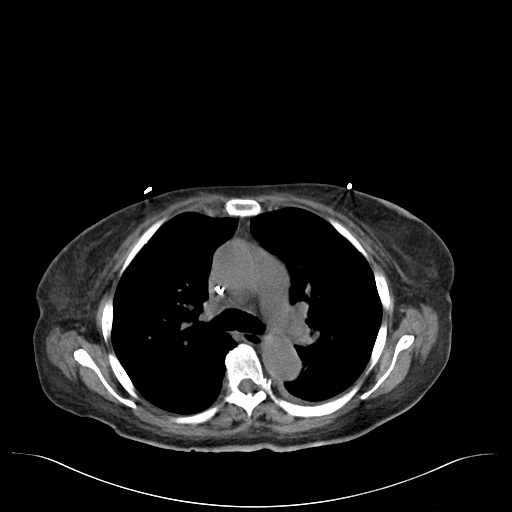
[im 111/129  lung]
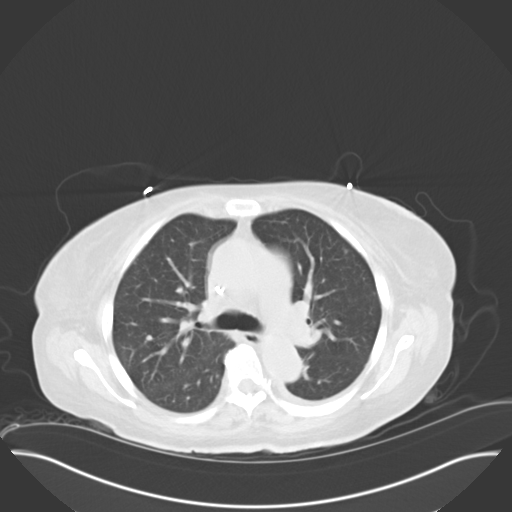
[im 120/129  mediastinal]
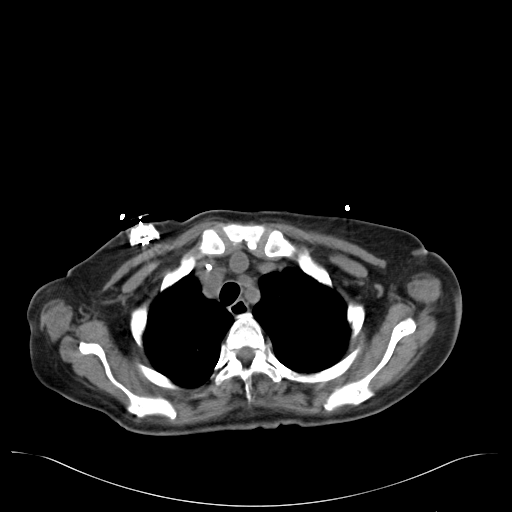
[im 120/129  lung]
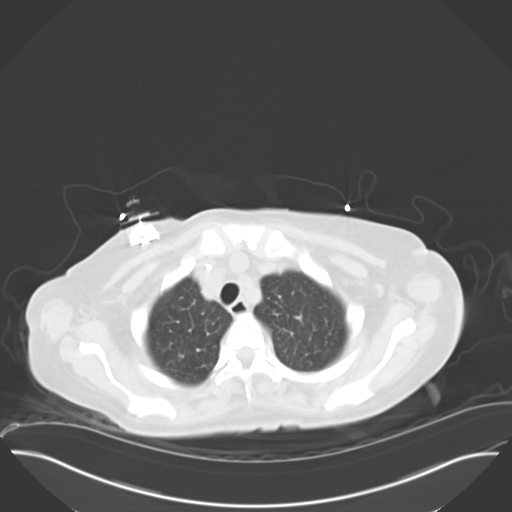

[13 of 29 positions shown; findings below may reference images not displayed]

PROCEDURE:     CT  - CT CHEST ABDOMEN AND PELVIS WO  - March 25, 2012 [DATE]

RESULT:     CT of the chest, abdomen and pelvis is performed images are
reconstructed at 5.0 mm slice thickness. Comparison is made to previous
images dated 06 March, 2012.

Previous study was performed with oral and IV contrast which significantly
increases sensitivity. There is a minimal atelectasis and respiratory motion
artifact at the lung bases. No discrete infiltrate, edema or mass is
evident. No endobronchial lesion is demonstrated. No pneumothorax is
evident. There is a trace left pleural effusion and a small to moderate
pericardial effusion. The previous study showed an abnormal appearance of
the liver consistent with ductal dilation and metastatic disease which is
not well demonstrated given the lack of contrast on today's exam. No static
lesions appear to be present most prominently in the posterior aspect of the
right lobe of the liver. The spleen is unremarkable. Trace left pleural
effusion is present. No definite hydrant nephrosis is seen. Cholecystectomy
clips are demonstrated. There appears to be some air in the pancreatic duct
region again this is poorly demonstrated because of the lack of contrast and
significant respiratory motion artifact. Third spacing of fluid is seen in
the subcutaneous tissues. Ascites is present in the pelvis. No abnormal
bowel distention or obstruction is evident. No pneumoperitoneum is seen. The
aorta is normal in caliber. Cystic area is again seen in the pelvis adjacent
to the proximal sigmoid colon best visualized on image 101 measuring 3.1 cm
medial to lateral and approximately 2.2 cm anterior-posterior. Foley
catheters present in the urinary bladder.
IMPRESSION: Very limited exam given the lack of IV contrast. The
patient has undergone recent study which was more diagnostic for evaluation
of the abdomen and pelvis demonstrating extensive metastatic disease.
Trace left pleural effusion. Pericardial effusion is present. The shows an
anterior-posterior dimension in the anterior portion of 11 mm on image 34.
There is no bowel obstruction or perforation evident. Limited visualization
of the pancreas show some air in the pancreatic duct. Cystic area adjacent
to the sigmoid colon is stable.

## 2015-04-18 NOTE — Consult Note (Signed)
PATIENT NAME:  Vicki Lopez, Vicki Lopez MR#:  [id number] DATE OF BIRTH:  15-Feb-1951  DATE OF CONSULTATION:  03/26/2012  REFERRING PHYSICIAN:   CONSULTING PHYSICIAN:  Knute Neu. Lorre Nick, MD  HISTORY OF PRESENT ILLNESS: Ms. Wirsing is a 63 year old patient who was admitted April 1st and was seen and evaluated by me on April 2nd. The patient was admitted with increasing abdominal pain, general weakness, failure to thrive, and also with temperatures low-grade of 100.3. She was given fluids and antibiotics. She has leukocytosis which is not a new finding. She was treated for an oral thrush. She was noted to have elevated troponins attributed to demand ischemia. 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   PAST MEDICAL HISTORY:  1. Hypertension.  2. Sleep apnea.  3. Depression.  4. Seasonal allergies. 5. Asthma.  6. Coombs positive hemolytic anemia, recently multifactorial anemia. 7. 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   PAST SURGICAL HISTORY:   1. Cholecystectomy. 2. Laparotomy. 3. Cesarean section.   ALLERGIES: Sulfa drugs.   MEDICATIONS AT THE TIME OF ADMISSION:  1. Augmentin twice a day.  2. Fentanyl patch 25 mcg. 3. Iron tablets daily.  4. Folic acid daily.   5. Lisinopril 20/hydrochlorothiazide 12.5. 6. Kayexalate p.r.n.   7. Lactulose p.r.n.  8. Ativan at bedtime p.r.n.  9. Multivitamin daily.  10. Oxycodone 5 mg every four hours p.r.n.  11. Prednisone 60 mg daily.  12. Phenergan 25 mg q.6 hours p.r.n.  13. Zantac 150 mg daily.  14. Timolol ophthalmic solution. 15. Xanax 0.5 mg daily p.r.n.   SOCIAL HISTORY: No alcohol or tobacco.   FAMILY HISTORY: Noncontributory.   ADDITIONAL SYSTEM REVIEW: When I saw the patient, she was generally weak with confusion, could answer some one-step questions, could  not give a complete review of systems but when directly asked denied headache, chest pain, shortness of breath, or abdominal pain and from the history there was no vomiting. No diarrhea. No fall or injury. No chills or sweats.   PHYSICAL EXAMINATION:   GENERAL: Was cooperative with some commands but was confused, not communicating. Neurologic grossly nonfocal.   HEENT: Sclerae no jaundice.   NECK: No mass.   LYMPH: No palpable lymph nodes in the neck, supraclavicular, submandibular, or axilla.   LUNGS: Clear. No wheezing or rales.   HEART: Regular. No murmur.   ABDOMEN: There was no tenderness although patient prior had been tender in the epigastrium. No palpable mass or organomegaly.   EXTREMITIES: 1+ ankle edema. No pretibial edema. No calf tenderness.   NEUROLOGIC: Grossly nonfocal but in the bed moving all extremities with evidence of confusion, distraction, restlessness, encephalopathy.   SKIN: No rash or bruising.   LABORATORY, DIAGNOSTIC, AND RADIOLOGICAL DATA: White count high at 72,000 with mature neutrophils. Hemoglobin 9.6, platelets 115,000, creatinine 0.83. Liver functions unremarkable. Albumin was 1.2. Troponin was elevated. Urinalysis was negative. Chest x-ray was unremarkable. EKG was unremarkable.   IMPRESSION AND PLAN: Low-grade fever. No definite infection. The patient has had leukocytosis attributed to leukemoid reaction. There is known metastatic disease and known liver metastasis, there is suspected bone marrow metastasis, pancreatic cancer, known metastatic disease to liver, known recently progressive disease known from scan, hypertension, recent thrush, and Coombs positive anemia being treated with steroids.   Also, as noted in recent hospitalizations and office notes, the patient is with  progressive cancer and poor prognosis, being considered for possible salvage chemotherapy. Then she developed waxing and waning liver functions and then leukocytosis and then  fever and then treatment was held. These conditions are now not improving, some waxing and waning of the laboratory studies and overall a decline now with confusion but no focal neurologic problems, more impaired and had to increase her narcotics, now with encephalopathy, looks multifactorial. Most likely the patient won't tolerate aggressive therapy and Palliative Care and Hospice care is reasonable and strongly being considered. However, after meeting with the patient and family and also discussed with Palliative Care physicians, determined that we would try to, along with maximum supportive care, adjust medication by reducing narcotics, reducing sedative medications, watching the electrolytes, can reduce the steroids, and if patient can rally any or if metabolic parameters remains stable, it is possible that even with poor prognosis and bedbound might be willing to try initial dose of therapy. Recognizing that chemotherapy with a poor performance status with a poor prognostic disease is unlikely to be helpful but with also the consideration that unlikely to cause other significant morbidity to a patient who is already impaired. This is being considered.   ____________________________ Knute Neuobert G. Lorre NickGittin, MD rgg:drc D: 03/27/2012 23:38:06 ET T: 03/28/2012 09:49:08 ET JOB#: [id number]  cc: Knute Neuobert G. Lorre NickGittin, MD, <Dictator> Marin RobertsOBERT G Verdun Rackley MD ELECTRONICALLY SIGNED 03/29/2012 17:14

## 2015-04-18 NOTE — H&P (Signed)
PATIENT NAME:  Vicki Lopez, Vicki Lopez MR#:  [id number] DATE OF BIRTH:  [birth year]  DATE OF ADMISSION:  03/13/2012  HISTORY OF PRESENT ILLNESS: Ms. [name] is a 64 year old patient who is admitted after presenting to the clinic and is hospitalized for symptomatic anemia with hemolytic immune anemia, mild hypotension, and tachycardia. Also abdominal pain from metastatic disease. 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 She is status post chemotherapy with FOLFIRINOX. Recently tumor markers are up indicating treatment failure and progressive disease. 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 The patient does not have any headache. She has increased general weakness and some orthostasis. No ear or jaw pain. No epistaxis or bleeding. She has had orange-colored urine for about 48 hours. No dysuria, no back pain, no vomiting, no diarrhea, appetite is down today, and intermittent abdominal pain that is relieved by taking oxycodone.   ALLERGIES: Sulfa drugs have caused a rash.   CURRENT MEDICATIONS: 1. Hydrochlorothiazide 12.5 mg/lisinopril 20 mg twice a day.  2. Oral iron daily.  3. Atenolol eye drops one twice a day. 4. Zofran 4 mg every six hours p.r.n.  5. Phenergan 25 mg every six hours p.r.n. 6. Xanax 0.5 mg three times daily p.r.n.  7. Potassium liquid 20 mEq p.o. daily x4 and twice a day three times a week.  8. Previously on hydrocodone with Tylenol, more recently on oxycodone.   PHYSICAL EXAMINATION:   GENERAL: Alert, cooperative, anxious.   HEENT: No jaundice of the sclerae.   MOUTH: No thrush.   LYMPH: No palpable lymph nodes in the neck, supraclavicular, submandibular, or axilla.   LUNGS: Clear with decreased air entry in all lung fields. No rales or wheezing.   ABDOMEN: Minimally tender in the right upper quadrant and epigastrium,  but no mass, no rebound, and no guarding or rigidity.   HEART: Regular with tachycardia.   EXTREMITIES: No edema. No calf tenderness.   NEUROLOGIC: Grossly nonfocal. Alert and cooperative. Moving all extremities against gravity. She is in the chair at this time. I did not test the gait.   LABS/STUDIES: Hemoglobin is 8.2 which has dropped recently. Platelets are 490. The white count is up to 28,000. Creatinine liver chemistries are pending. Two days ago creatinine and liver were normal. Last tumor marker CA-19-9 was elevated at over 570.   IMPRESSION: The patient is with symptomatic anemia, hemolytic anemia, weakness, hypotension, and tachycardia. She has a leukocytosis which is probably stimulated bone marrow without any definite evidence of infection. The hemolytic anemia is either spontaneous or  possibly attributed to effects of recent chemotherapy and usual occurrence. No current evidence of infection.   PLAN: The patient is hospitalized for monitoring of hemoglobin and electrolytes, high-volume IV fluids, support the blood pressure, hold antihypertensives, use pain medications p.r.n., start steroids initially high dose and then at least 60 mg a day, and prednisone. Would culture blood and urine. Would watch the PT and PTT. Would put the patient on telemetry. Plans for salvage chemotherapy with Abraxane and Gemzar are on hold until the current issue is addressed.  ____________________________ Knute Neuobert G. Lorre NickGittin, MD rgg:slb D: 03/13/2012 11:56:38 ET     T: 03/13/2012 12:16:19 ET       JOB#: [id number] cc: Knute Neuobert G. Lorre NickGittin, MD, <Dictator> Marin RobertsOBERT G GITTIN MD ELECTRONICALLY SIGNED 03/15/2012 13:48

## 2015-04-18 NOTE — Discharge Summary (Signed)
PATIENT NAME:  Vicki Lopez, Vicki Lopez MR#:  [id number] DATE OF BIRTH:  Aug 20, 1951  DATE OF ADMISSION:  03/17/2012 DATE OF DISCHARGE:  03/20/2012  DISCHARGE DIAGNOSES:  1. Fever of unknown origin, possibly due to metastatic pancreatic cancer.  2. Abdominal pain due to metastatic pancreatic cancer.  3. History of Coombs positive hemolytic anemia.  4. Hypertension.  5. Leukocytosis, possibly due to stress, steroids, versus liver and possible bone marrow mets. 6. Hypokalemia.   DISPOSITION: The patient is being discharged home with Home Health RN, PT, and aide.   ACTIVITY: As tolerated.   DIET: Low sodium.   FOLLOWUP: Follow up with Dr. Lorre Nick and Dr. Elease Hashimoto 1 to 2 weeks after discharge.   DISCHARGE MEDICATIONS:  1. Prednisone 60 mg daily.  2. Fentanyl 25 mcg q. 72 h.  3. Xanax 0.25 mg q. 8 hours p.r.n. anxiety. 4. Augmentin 875 mg b.i.d. for seven days.  5. Senokot 1 tablet p.o. b.i.d. 6. Folic acid 1 mg daily.  7. Oxycodone 5 mg q. 4 h. p.r.n.  8. Lactulose 15 mL q. 6 hours p.r.n. constipation.  9. Kaochlor S-F 15 mEq once a day. 10. Promethazine 25 mg q. 6 hours p.r.n.  11. Nystatin 5 to 10 mL  q.i.d. p.r.n. thrush.  12. HCTZ/lisinopril 12.5/20, 1 tablet daily.  13. Zofran 4 mg q. 6 hours p.r.n.  14. Timolol 0.5% one drop to each affected eye twice a day.  15. Ferrous sulfate 325 mg daily.  16. Multivitamin 1 tablet daily.  17. Zantac 150 mg twice a day.   RESULTS: Blood cultures showed no growth so far. Urinalysis showed no evidence of infection. CT scan of the abdomen and pelvis showed ill-defined pancreatic mass consistent with the patient's history of pancreatic cancer.  The mass is decreased in size compared to 12/08/2011.  There are numerous hypodense masses throughout the liver, most severe in the left hepatic lobe consistent with metastatic disease. There was no infectious source in the abdomen. Chest x-ray showed increased density in the right costophrenic angle compatible  with small infiltrate or trace effusion. CBC: White count is 42.7 to 49.3, hemoglobin 6.8 to 9.1. Normal platelet count. Complete metabolic panel normal other than potassium of 3.7, which is supplemented. Normal LFTs.   HOSPITAL COURSE: The patient is a 64 year old female with past medical history of metastatic pancreatic cancer, hypertension, Coombs positive hemolytic anemia, and depression who presented with fever, abdominal pain, and weakness. The patient had a fever of 101. She was admitted to the hospital and had extensive infectious work-up, but there was no obvious source of infection. Her urinalysis did not show any evidence of infection. Chest x-ray showed no definite infiltrate. Blood cultures were sent, which have been negative so far. CT scan of the abdomen was also obtained and showed no infectious source or source of fever. She was initially empirically treated with IV antibiotics and is being transitioned to oral antibiotics on the recommendation of Dr. Lorre Nick. The patient had ongoing abdominal pain due to metastatic pancreatic cancer and a fentanyl patch has been added. In addition, she will continue taking p.r.n. oxycodone.  During the hospitalization with fluids her hemoglobin fell to 6.8. She received 1 unit of transfusion and her hemoglobin was up to 9.1 by the time of discharge. The patient had severe leukocytosis which was felt to be due to stress, possible infection versus steroids.  Dr. Lorre Nick also felt that it could be related to liver and possible bone marrow mets.  After her initial  fever spike the patient remained afebrile during the hospitalization. The patient is being discharged home on oral antibiotics, pain medications, and steroids on the recommendation of Dr. Lorre NickGittin. Home Health has also been arranged for the patient.   TIME SPENT: 45 minutes.  ____________________________ Darrick MeigsSangeeta Shaman Muscarella, MD sp:bjt D:  03/20/2012 17:11:40 ET          T: 03/21/2012 11:44:18 ET          JOB#: [id number]  cc: Leo GrosserNancy J. Maloney, MD Darrick MeigsSANGEETA Sinan Tuch MD ELECTRONICALLY SIGNED 03/21/2012 12:56

## 2015-04-18 NOTE — Discharge Summary (Signed)
PATIENT NAME:  Vicki Lopez, Vicki Lopez MR#:  [id number] DATE OF BIRTH:  03/16/1951  DATE OF Theone MurdochDMISSION:  03/13/2012 DATE OF DISCHARGE:  03/16/2012  FINAL DIAGNOSES:  1. Coombs positive hemolytic anemia.  2. Anemia with contribution from underlying malignancy and chronic disease.  3. Pancreatic cancer metastatic to liver.  4. Leukocytosis.   HISTORY AND PHYSICAL: As dictated on admission, 03/13/2012.  HOSPITAL COURSE: The patient was initially treated and given intravenous fluids and pain medication p.r.n. and steroids were started, prednisone 60 mg daily. Good urine output was maintained. The electrolytes were followed. The patient did not have any signs of instability or signs of active infection, symptomatically he was stable, and on 03/16/2012 when seen by Dr. Janese BanksSandeep Pandit in my absence was found to have overall improved strength with no cough or wheezing, chest symptoms or any new abdominal symptoms, and the physical exam was stable. The hemoglobin was still low, but still holding at 7.9 grams with still leukocytosis. There was a low-grade temperature without any evidence of infection. Pain was well controlled. The patient was discharged on medications that included prednisone and had an [facility], 03/18/2012, for follow-up.   DISCHARGE MEDICATIONS:  1. Oxycodone 5 mg 1 tablet every four hours p.r.n.  2. Lactulose 15 mL every six hours p.r.n.  3. Potassium elixir 20 mEq daily.  4. Xanax 0.5 mg three times daily p.r.n. 5. Phenergan 25 mg every six hours p.r.n. 6. Hydrochlorothiazide/lisinopril 12.5 mg hydrochlorothiazide and 20 mg of lisinopril, 1 tablet daily. 7. Zofran p.r.n. for nausea. 8. Timolol eye drops one twice a day.  9. Oral iron twice a day.  10. Multivitamins.  11. Zantac 150 mg twice a day. 12. Folic acid 1 mg daily. 13. Prednisone 60 mg daily.  ____________________________ Knute Neuobert G. Lorre NickGittin, MD rgg:slb D: 03/29/2012 17:23:00  ET T: 04/01/2012 09:53:58 ET JOB#: [id number]  cc: Knute Neuobert G. Lorre NickGittin, MD, <Dictator> Marin RobertsOBERT G GITTIN MD ELECTRONICALLY SIGNED 04/05/2012 9:09

## 2015-04-18 NOTE — Consult Note (Signed)
Chief Complaint:   Subjective/Chief Complaint some abdo pain this afternoon   VITAL SIGNS/ANCILLARY NOTES: **Vital Signs.:   26-Mar-13 18:00   Vital Signs Type Recheck   Systolic BP Systolic BP 282   Diastolic BP (mmHg) Diastolic BP (mmHg) 78   Mean BP 98   BP Source manual    21:03   Vital Signs Type Routine   Temperature Temperature (F) 98.1   Celsius 36.7   Temperature Source oral   Pulse Pulse 72   Pulse source per Dinamap   Respirations Respirations 20   Systolic BP Systolic BP 081   Diastolic BP (mmHg) Diastolic BP (mmHg) 88   Mean BP 107   Pulse Ox % Pulse Ox % 99   Pulse Ox Activity Level  At rest   Oxygen Delivery Room Air/ 21 %   Brief Assessment:   Cardiac Regular    Respiratory normal resp effort    Gastrointestinal details normal Soft    Additional Physical Exam neuro non focal, alert and cooperative, mood good, no edema   Routine Chem:  26-Mar-13 04:09    Glucose, Serum 110   BUN 7   Creatinine (comp) 0.57   Sodium, Serum 138   Potassium, Serum 3.2   Chloride, Serum 102   CO2, Serum 24   Calcium (Total), Serum 8.3   Osmolality (calc) 274   eGFR (African American) >60   eGFR (Non-African American) >60   Anion Gap 12  Routine Hem:  26-Mar-13 04:09    WBC (CBC) 45.0   RBC (CBC) 2.74   Hemoglobin (CBC) 8.3   Hematocrit (CBC) 24.5   Platelet Count (CBC) 165   MCV 89   MCH 30.1   MCHC 33.7   RDW 17.1   Neutrophil % 91.4   Lymphocyte % 3.3   Monocyte % 5.2   Eosinophil % 0.1   Basophil % 0.0   Neutrophil # 41.1   Lymphocyte # 1.5   Monocyte # 2.3   Eosinophil # 0.1   Basophil # 0.0  TDMs:  26-Mar-13 22:19    Vancomycin, Trough LAB 15   Assessment/Plan:  Assessment/Plan:   Assessment vss  afebrile now on antibiotics, no obvious source  leukocytosis persists, likely marrow and known liver infiltration, plus steroid effect. hgb beter , up appropriately after transfusion, no accelerated hemolysis but urine still abnormal appearance.     Plan f/u cbc, cre, k, watch pain, watch temp, adjust pain meds, may want additional transfusion or later iv iron, recheck iron studies, later change empirically to po meds, loking at timing for salvage chemotx, periodically check lfts   Electronic Signatures: Dallas Schimke (MD)  (Signed 26-Mar-13 23:26)  Authored: Chief Complaint, VITAL SIGNS/ANCILLARY NOTES, Brief Assessment, Lab Results, Assessment/Plan   Last Updated: 26-Mar-13 23:26 by Dallas Schimke (MD)

## 2015-04-18 NOTE — Consult Note (Signed)
Brief Consult Note: Diagnosis: PANCREATIC CANCER METASTATIC TO LIVER, ANEMIA DUE TO HEMOLYSIS, CHEMOTX, MALIGNANCY.   Patient was seen by consultant.   Recommend further assessment or treatment.   Discussed with Attending MD.   Comments: SEE DICTATED NOTE TO FOLLOW.Marland Kitchen.ACUTE ISSUES PAIN CONTROL, FEVER, SOURCE UNKNOWN, POSSIBLE TUMOR RELATED, LEUKOCYTOSIS POSSIBLY INFECTIOUS OR TUMOR RELATED, ANEMIA, NO ACTIVE BLEED, POS COOMBS BUT LOW LEVELS BILI, LDH, AND RECENTLY NORMAL HAPTOGLOBIN, HGB FALLING IN SPITE OF WEEK OF PREDNISONE. AGREE WITH ANTIBIOTICS, F/U CULTURES, AND TRANSFUSE CAUTIOUSLY, WATCH URINE, HGB RESPONSE, LATER DECISION RE SALVAGE TX, HAD PLANNED ABRAXANE AND GEMZAR.  Electronic Signatures: Marin RobertsGittin, Robert G (MD)  (Signed 25-Mar-13 21:10)  Authored: Brief Consult Note   Last Updated: 25-Mar-13 21:10 by Marin RobertsGittin, Robert G (MD)

## 2015-04-18 NOTE — Consult Note (Signed)
Chief Complaint:   Subjective/Chief Complaint NO SPECIFIC COMPLAINTS, RESTLESS, UNABLE TO GIVE ROS   VITAL SIGNS/ANCILLARY NOTES: **Vital Signs.:   04-Apr-13 20:48   Vital Signs Type Q 4hr   Temperature Temperature (F) 98.4   Celsius 36.8   Temperature Source axillary   Pulse Pulse 95   Pulse source per Dinamap   Respirations Respirations 18   Systolic BP Systolic BP 034   Diastolic BP (mmHg) Diastolic BP (mmHg) 84   Mean BP 98   BP Source Dinamap   Pulse Ox % Pulse Ox % 95   Pulse Ox Activity Level  At rest   Oxygen Delivery Room Air/ 21 %   Brief Assessment:   Cardiac Regular    Respiratory normal resp effort  clear BS    Gastrointestinal details normal MILD ABDO TENDER, SLIGHTLY DISTENDED    Additional Physical Exam NEURO NON FOCAL, LETHARGIC, WILL ANSWER SOME QUESTIONS DENIED PAIN OR SOB. CONFUSED, NO CONVERSATION, LESS SEDATED THAN PRIOR DAY   Routine Hem:  04-Apr-13 03:29    WBC (CBC) 59.4   RBC (CBC) 3.27   Hemoglobin (CBC) 9.7   Hematocrit (CBC) 28.9   Platelet Count (CBC) 53   MCV 88   MCH 29.7   MCHC 33.7   RDW 17.2  Routine Chem:  04-Apr-13 03:29    Creatinine (comp) 0.86  Hepatic:  04-Apr-13 03:29    Bilirubin, Total 1.5   Alkaline Phosphatase 102   SGPT (ALT) 20   SGOT (AST) 54   Total Protein, Serum 6.0   Albumin, Serum 1.8  Routine Chem:  04-Apr-13 03:29    eGFR (African American) >60   eGFR (Non-African American) >60  Routine Hem:  04-Apr-13 03:29    Neutrophil % 94.1   Lymphocyte % 2.5   Monocyte % 3.1   Eosinophil % 0.3   Basophil % 0.0   Neutrophil # 55.9   Lymphocyte # 1.5   Monocyte # 1.9   Eosinophil # 0.2   Basophil # 0.0  Hepatic:  04-Apr-13 03:29    Bilirubin, Direct 0.5  Routine Chem:  04-Apr-13 03:30    Calcium (Total), Serum 9.3   Assessment/Plan:  Assessment/Plan:   Assessment 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 FTT. RECENT FEVER, NO EVIDENCE INFECTION. LEUKOCYTOSIS/LEUKEMOID RX, NO IMMATURE CELLS,  PROGRESSIVE THROMBOCYTOPENIA, LIKELY DUE TO METASTATIC CANCER, POSSIBLE NEUTRITIONAL, ANTIBIOTCS, , DIC/MALIGNANCY. ENCEPHALOPATHY, NO EVIDENCE OF RESP FAILURE, NO BRAIN METS ON ON CONTRAST CT, NARCOTIC EFFECTS, LFT OK, CORRECTED CALCIUM IS HIGH BUT ONLY MODESTLY, POSSIBLE STEROID EFFECTS. ANEMIA MULTIFACTOIRIAL INCLUDING HEMOLYSIS. WITH REDUCTION OF NARCOTICS AND S/P TRANSFUSION ,  LESS SEDATED BUT PAIN RETURNED MODESTLY, AND MENTAL STATUS OTHERWISE DID NOT IMPROVE. WITH POOR PERFORMANCE STATUS AND IMPAIRED MENTAL STATUS .Marland KitchenDECISION TO DELIVER PALLIATION, NO ADDITIONAL ANTINEOPLASIC TX, DISCUSSSED AT [facility] REDUCE STEROIDS, INCREASE DURAGESIC, CHANGE OXYCODONE TO LIQUID, D/C ANTIBIOTICS, PLAN DISCHARGE WITH HOSPICE   Electronic Signatures: Dallas Schimke (MD)  (Signed 04-Apr-13 21:27)  Authored: Chief Complaint, VITAL SIGNS/ANCILLARY NOTES, Brief Assessment, Lab Results, Assessment/Plan   Last Updated: 04-Apr-13 21:27 by Dallas Schimke (MD)

## 2015-04-18 NOTE — Consult Note (Signed)
Brief Consult Note: Diagnosis: PANCREATIC CANCER, LIVER METASTASIS, FEVER, LEUKOCYTOSIS, ANEMIA, THROMBOCYTOPENIA.   Patient was seen by consultant.   Recommend further assessment or treatment.   Orders entered.   Comments: SE DICTATED NOTE TO FOLLOW DISCUSSED WITH PALLIATIVE CARE FAMILY MEETING TONIGHT WITH PATIENT AT BEDSIDE. PATIENT UNDERSTANDS THAT DISEASE IS ADVANCED AND THAT BLOOD FINDINGS, FEVER, PAIN, WEAKNESS, ARE INDICATORS OF LIFE THREATENING DISEASE. TODAY, ALTHOUGH CONSIDERING PALLIATIVE CARE ONLY, WILL SIMULTANEOUSLY TRY TO IMPROVE PERFORMANCE STATUS WITH ANTIBIOTICS, PLAN BLOOD TOMORROW, REDUCE NARCOTICS, F/U CBC AND CHEMISTRIES, FOR POSSIBILITY THAT CHEMOTX CAN BE TRIED,EVEN WITH LOW BLOOD PLATELET COUNTS AND POOR PERFORMANCE STATUS. RISK OF WORSENING CONDITION IS RECOGNIZED BUT WITHOUT ANY CHANCE OF IMPROVEMENT, RAPID PROGRESSIVE ILLNESS IS OTHERWISE EXPECTED. WILL REEVALUATE OVER NEXT 48 HRS. TODAY, AFTER INITIALLY SEDATED, PATIENT WAS ALERT, COOPERATIVE, UNDERSTOOD ISSUES, BUT SLUGGISH AND MINIMAL STRENTH, LIMITED TO THE BED.  Electronic Signatures: Marin RobertsGittin, Robert G (MD)  (Signed Barbette Merino02-Apr-13 21:02)  Authored: Brief Consult Note   Last Updated: 02-Apr-13 21:02 by Marin RobertsGittin, Robert G (MD)

## 2015-04-18 NOTE — H&P (Signed)
PATIENT NAME:  Vicki Lopez, Vicki Lopez MR#:  [id number] DATE OF BIRTH:  [birth year]  DATE OF ADMISSION:  03/25/2012  ADMITTING PHYSICIAN: Enid Baas, MD  PRIMARY CARE PHYSICIAN: Lorie Phenix, MD  PRIMARY ONCOLOGIST: Benita Gutter, MD  CHIEF COMPLAINT: Increasing abdominal pain and low-grade fever.   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 She comes in with worsening abdominal pain and generalized weakness and unable to get around at home. The patient has had several admissions recently in the last three months and was just discharged on 03/20/2012. The patient says she has been more weak, tired, and not able to get around and has been having worsening abdominal pain, in the upper abdomen, in spite of her pain medications. She says she lives at home by herself and her sister lives close by and helps her around. No family is present currently. The patient is tachycardic with a heart rate of 117 and has a low-grade fever of 100.3 degrees Fahrenheit, in the emergency department, but if you look at her past admissions she has been having low-grade fevers on and off. I am not sure if underlying infection or just her malignancy causing low-grade fevers. However, she had a CT of her chest, abdomen, and pelvis that was just performed and is not available yet.   PAST MEDICAL HISTORY:  1. Stage IV pancreatic cancer with metastasis to the liver, diagnosed in August 2012. Currently on palliative chemotherapy. Follows with Dr. Lorre Lopez. 2. Hypertension.  3. Sleep apnea.  4. Depression.  5. Seasonal allergies.  6. Asthma.  7. Coombs positive hemolytic anemia.  8. Persistent leukocytosis secondary to stress steroids and also  bone marrow stimulation with possible mets and malignancy.   PAST SURGICAL HISTORY:  1. Cholecystectomy.  2. Laparotomy and enteroclysis.  3. Cesarean section.   ALLERGIES: Sulfa drugs.   CURRENT MEDICATIONS:  1. Augmentin 875 mg 1 tablet p.o. twice a day for 3 more days. 2. Fentanyl patch 25 mcg transdermal every three days. 3. Ferrous sulfate 325 mg p.o. daily.  4. Folic acid 1 mg p.o. daily.  5. Lisinopril/HCTZ 20/12.5 mg 1 tablet p.o. daily.  6. Kayexalate 15 mg daily for constipation.  7. Lactulose 15 mL p.o. every 6 hours as needed for constipation.  8. Ativan 0.5 mg at nighttime before for chemotherapy.  9. Multivitamin one tablet p.o. daily.  10. Nystatin 100,000 units/mL suspension, 5 mL orally four times daily p.r.n. thrush.  11. Oxycodone 5 mg p.o. every 4 hours p.r.n.  12. Prednisone 60 mg p.o. daily.  13. Promethazine 25 mg p.o. every six hours p.r.n. for nausea and vomiting. 14. Ranitidine 150 mg p.o. daily.  15. Senokot 8.6 mg 1 tablet p.o. twice a day. 16. Timolol ophthalmic suspension one drop to both eyes twice a day.  17. Xanax 0.5 mg p.o. three times daily p.r.n. for anxiety.   SOCIAL HISTORY: She lives at home by herself. Her sister helps her. Both parents are living. No history of any alcohol or smoking.   FAMILY HISTORY: She says she has two uncles with cancer, unknown cancer. Both parents are living. The patient could not think what medical problems they have.   REVIEW OF SYSTEMS: CONSTITUTIONAL: Positive for low grade fever, fatigue, and weakness. EYES: No blurred  vision, double vision, pain, inflammation, glaucoma, or cataracts. ENT: No tinnitus, ear pain, hearing loss, epistaxis, or discharge. RESPIRATORY: No cough, wheeze, hemoptysis, or chronic obstructive pulmonary disease. CARDIOVASCULAR: No chest pain, orthopnea, edema, arrhythmia, palpitations, or syncope. GI: Positive for decreased appetite and also epigastric pain. No nausea or vomiting. Positive for  diarrhea with chemotherapy. No hematemesis or melena. GENITOURINARY: No dysuria, hematuria, renal calculus, frequency, or incontinence. ENDOCRINE: No polyuria, nocturia, thyroid problems, heat or cold intolerance. HEMATOLOGY: positive for anemia. No easy bruising or bleeding. SKIN: No acne, rash, or lesions. MUSCULOSKELETAL: Generalized weakness but no shoulder pain, joint pain, or gout. NEUROLOGIC: No numbness, weakness, cerebrovascular accident, transient ischemic attack, or seizures. PSYCHOLOGICAL: Positive for anxiety. No insomnia or depression or bipolar history.    PHYSICAL EXAMINATION:   VITAL SIGNS: Temperature 100.3 degrees Fahrenheit, pulse 117, blood pressure 144/80,  saturation 97% on room air, and respirations 20.   GENERAL: A thin built, poorly nourished female lying in bed not in any acute distress, slightly sleepy.   HEENT: Normocephalic, atraumatic. Pupils equal, round, and reacting to light. Mildly icteric sclerae. Oropharynx edentulous without any teeth and tongue has whitish coating. Clinically appears dry with dry mucous membranes.   NECK: Supple. No thyromegaly, JVD, or carotid bruits. No lymphadenopathy.   LUNGS: Moving air bilaterally. No wheeze or crackles. No use of accessory muscles for breathing.   HEART: S1 and S2 regular rate and rhythm. No murmurs, rubs, or gallops.   ABDOMEN: Soft. Mild tenderness in the epigastric region. No hepatosplenomegaly. Normal bowel sounds.   EXTREMITIES: 1+ pedal edema. Palpable dorsalis pedis pulses present bilaterally. No clubbing or cyanosis.   NEUROLOGIC: Cranial nerves intact. Globally weak but no focal motor or sensory deficits.   PSYCHOLOGICAL: The patient is awake, alert, and oriented x3 but very slow to respond.   SKIN: No acne, rash, or lesions.   LABORATORY DATA: WBC 72.4, hemoglobin 9.6, hematocrit 28.5, platelet count 115.   Sodium 136, potassium 3.5, chloride 97, bicarbonate 25, BUN 18, creatinine 0.83, glucose 90,  calcium 9.6.   ALT 21, AST 54, alkaline phosphatase 115, total bilirubin 1.2, albumin 3.2. Troponin 0.38. Lipase 38.   Urinalysis: Negative for any infection.  Chest x-ray: Poor inspiratory effort with bibasilar atelectasis. Borderline cardiomegaly is present.   EKG: Normal sinus rhythm, nonspecific T wave abnormality.   ASSESSMENT AND PLAN: This is a 64 year old female with metastatic pancreatic cancer diagnosed in August 2012 currently on chemotherapy with metastasis to liver and recent CT of the abdomen in March 2013 showing persistent liver lesions with slight decrease in pancreatic mass, with multiple admissions in the last three months for healthcare acquired pneumonia, who  comes from home for worsening abdominal pain, low-grade fever, and also leukocytosis. 1. Fever and leukocytosis: Chest x-ray is negative. Urinalysis is negative for any infection. Blood cultures are pending. Less likely to be pneumonia or other infection. The low-grade fever could be just from her underlying malignancy itself. She has been almost continuously on antibiotics for the past one month for similar diagnosis. She was just discharged three days ago with Augmentin. We will start with vancomycin, Zosyn, and azithromycin for broader coverage until cultures are negative and discontinue antibiotics if not needed afterwards. CT of the chest is done and that is pending.  2. Persistent leukocytosis: It appears worsened. In the past her WBC counts have been around 40,000, but per Dr. Lorre NickGittin this is due to stress steroids or underlying bone marrow stimulation from metastatic malignancy. Currently she  is covered with antibiotics. She has a poor prognosis. Oncology consult is pending.  3. Metastatic stage IV pancreatic cancer, diagnosed in August 2012 and has been on palliative chemotherapy from September with not much improvement. Recent CT in March 2013 is showing slight decrease in the pancreatic mass size, but same size liver  lesions. She had another CT of the abdomen done today. We will have oncology evaluate her for prognostic purposes and also palliative care consult. She will need pain management. Also hospice screening has been  ordered.  Code status was FULL CODE earlier and has been discussed with the patient at bedside with the ED nurse witnessing and she wants to be DO NOT RESUSCITATE.  4. Hypertension: Her blood pressure is normal currently and she appears clinically dry, so we will give her fluids and hold her blood pressure medication, Lisinopril/ HCTZ, at this time.  5. 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 Continue to monitor for now.  6. Oral thrush: Continue nystatin. 7. GI and DVT prophylaxis: She is on Protonix and subcutaneous heparin.  8. Elevated troponins, likely demand ischemia, less likely to be a cardiac event: We will not give her any IV heparin anyway because of her anemia history. EKG does not show any ST-T wave abnormalities. Continue to cycle cardiac enzymes.   CODE STATUS: DO NOT RESUSCITATE, as discussed with the patient at the bedside with the ED nurse, Mellody Memos, witnessing.   TIME SPENT ON ADMISSION: 50 minutes.  ____________________________ Enid Baas, MD rk:slb D: 03/25/2012 12:15:05 ET T: 03/25/2012 13:01:50 ET JOB#: [id number]  cc: Enid Baas, MD, <Dictator> Leo Grosser, MD Knute Neu. Vicki Nick, MD Enid Baas MD ELECTRONICALLY SIGNED 03/29/2012 13:48

## 2015-04-18 NOTE — Consult Note (Signed)
Chief Complaint:   Subjective/Chief Complaint NO SPECIFIC COMPLAINTS, RESTLESS, UNABLE TO GIVE ROS   VITAL SIGNS/ANCILLARY NOTES: **Vital Signs.:   03-Apr-13 20:28   Vital Signs Type Routine   Temperature Temperature (F) 98.7   Celsius 37   Temperature Source oral   Pulse Pulse 96   Pulse source per Dinamap   Respirations Respirations 22   Systolic BP Systolic BP 413   Diastolic BP (mmHg) Diastolic BP (mmHg) 93   Mean BP 114   BP Source Dinamap   Pulse Ox % Pulse Ox % 98   Pulse Ox Activity Level  At rest   Oxygen Delivery Room Air/ 21 %    21:58   Vital Signs Type Pre-Blood   Temperature Temperature (F) 97.9   Celsius 36.6   Temperature Source oral   Pulse Pulse 82   Pulse source per Dinamap   Respirations Respirations 20   Systolic BP Systolic BP 244   Diastolic BP (mmHg) Diastolic BP (mmHg) 010   Mean BP 135   BP Source Dinamap   Pulse Ox % Pulse Ox % 99   Oxygen Delivery Room Air/ 21 %    21:59   Vital Signs Type Recheck   Systolic BP Systolic BP 272   Diastolic BP (mmHg) Diastolic BP (mmHg) 536   Mean BP 121   BP Source manual    22:30   Vital Signs Type 15 min Post Blood Start Time   Temperature Temperature (F) 97.9   Celsius 36.6   Temperature Source axillary   Pulse Pulse 77   Pulse source per Dinamap   Respirations Respirations 18   Systolic BP Systolic BP 644   Diastolic BP (mmHg) Diastolic BP (mmHg) 87   Mean BP 104   BP Source Dinamap   Pulse Ox % Pulse Ox % 97   Oxygen Delivery Room Air/ 21 %    23:30   Vital Signs Type Blood Transfusion   Brief Assessment:   Cardiac Regular    Respiratory normal resp effort  clear BS    Gastrointestinal details normal Nontender    Additional Physical Exam NEURO NON FOCAL, LETHARGIC, WILL ANSWER SOME QUESTIONS DENIED PAIN OR SOB CONFUSED, NO CONVERSATION, HAD CONVEVRSATION WITH FAMILY MEMBER EARLIER TODAY, OVERALL SLIGHTLY IMPRVED FROM YESTERDAY, STILL IMPAIRED   Routine Hem:  03-Apr-13 04:33    WBC  (CBC) 64.4   RBC (CBC) 2.90   Hemoglobin (CBC) 8.4   Hematocrit (CBC) 25.2   Platelet Count (CBC) 74   MCV 87   MCH 29.1   MCHC 33.5   RDW 18.1  Routine Chem:  03-Apr-13 04:33    Glucose, Serum 81   BUN 16   Creatinine (comp) 0.79   Sodium, Serum 139   Potassium, Serum 3.2   Chloride, Serum 102   CO2, Serum 25   Calcium (Total), Serum 8.9  Hepatic:  03-Apr-13 04:33    Bilirubin, Total 1.2   Alkaline Phosphatase 100   SGPT (ALT) 17   SGOT (AST) 40   Total Protein, Serum 5.9   Albumin, Serum 1.7  Routine Chem:  03-Apr-13 04:33    Osmolality (calc) 278   eGFR (African American) >60   eGFR (Non-African American) >60   Anion Gap 12  Routine Hem:  03-Apr-13 04:33    Neutrophil % 95.0   Lymphocyte % 2.0   Monocyte % 2.8   Eosinophil % 0.2   Basophil % 0.0   Neutrophil # 59.8   Lymphocyte # 1.3  Monocyte # 1.8   Eosinophil # 0.1   Basophil # 0.0  Routine BB:  03-Apr-13 04:33    Antibody Screen NEGATIVE   Crossmatch Unit 1 Ready  Routine Chem:  03-Apr-13 04:33    LDH, Serum 343  Routine BB:  03-Apr-13 04:33    Direct Coombs, Polyspecific POSITIVE    07:38    Direct Coombs, Polyspecific -   Radiology Results: CT:    03-Apr-13 17:33, CT Head Without Contrast   CT Head Without Contrast    REASON FOR EXAM:    altered mental status  COMMENTS:       PROCEDURE: CT  - CT HEAD WITHOUT CONTRAST  - Mar 27 2012  5:33PM     RESULT: History: Altered mental status.    Comparison Study: No recent.    Findings: Standard nonenhanced CT obtained. No mass. No hydrocephalus. No   hemorrhage. No acute bony abnormality.    IMPRESSION:  No acute abnormality.        Verified By: Osa Craver, M.D., MD   Assessment/Plan:  Assessment/Plan:   Assessment 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 FTT. RECENT FEVER, NO EVIDENCE INFECTION. LEUKOCYTOSIS/LEUKEMOID RX, NO IMMATURE CELLS, PROGRSSIVE THROMBOCYTOPENIA, LIKELY DUE TO METASTATIC CANCER, POSSIBLE NEUTRITIONAL,  ANTIBIOTCS, POSSIBLE HEPARIN EFECTS, DIC/MALIGNANCY. ENCEPHALOPATHY, NO EVIDENCE OF RESP FAILURE, NO BRAIN METS ON ON CONTRAST CT, NARCOTIC EFFECTS, LFT OK, CORRECTED CALCIUM IS HIGH BUT ONLY MODESTLY, POSSIBLE STEROID EFFECTS. ANEMIA MULTIFACTOIRIAL INCLUDING HEMOLYSIS    Plan SEE ALSO CONSULT . WILL REDUCE NARCOTICS, REDUCE STEROIDS, TRANSFUSE PRBC, CT DONE, F/U CBC AND CRE AND LFT AND CA, CONSIDER DOSE OF CHEMOTX EVEN WITH POR PERFORMANCE STATUS, SEEE ALSO PALIATIVE CARE NOTE   Electronic Signatures: Dallas Schimke (MD)  (Signed 03-Apr-13 23:07)  Authored: Chief Complaint, VITAL SIGNS/ANCILLARY NOTES, Brief Assessment, Lab Results, Radiology Results, Assessment/Plan   Last Updated: 03-Apr-13 23:07 by Dallas Schimke (MD)

## 2015-04-18 NOTE — Discharge Summary (Signed)
PATIENT NAME:  Vicki Lopez, Vicki Lopez MR#:  [id number] DATE OF BIRTH:  1951/10/21  DATE OF ADMISSION:  03/25/2012 DATE OF DISCHARGE:  03/29/2012  ADMITTING PHYSICIAN: Enid Baas, MD  DISCHARGING PHYSICIAN: Enid Baas, MD  PRIMARY CARE PHYSICIAN: Lorie Phenix, MD   CONSULTANTS:  1. Ned Grace, MD - Palliative Care. 2. Benita Gutter, MD - Oncology.  DISCHARGE DIAGNOSES:  1. Fever likely secondary to underlying malignancy.  2. Progressive pancreatic cancer, failed chemotherapy.  3. Metastatic pancreatitis cancer with liver metastases.  4. Chronic abdominal pain.  5. Hypertension.  6. Coombs positive hemolytic anemia.  7. Thrombocytopenia. 8. Leukocytosis likely leukemoid reaction and bone marrow stimulation from cancer.  9. Sleep apnea. 10. Depression.  11. Seasonal allergies. 12. Asthma.    DISCHARGE MEDICATIONS:  1. Prednisone 10 mg p.o. daily.  2. Lisinopril/HCTZ 20/25 mg 1 tablet p.o. daily.  3. Metoprolol 25 mg p.o. twice a day. 4. Ativan 0.5 mg p.o. every 4 hours p.r.n.  5. Fentanyl 25 mcg transdermal patch every three days.  6. Oxycodone 5 mg/5 mL - 5 to 10 mg p.o. every 4 hours p.r.n. for pain.   DISCHARGE DIET: As tolerated.   DISCHARGE ACTIVITY: As tolerated.  DISPOSITION: The patient is being discharged home with home hospice.   DISCHARGE FOLLOWUP: Follow with PCP.   LABS AND IMAGING STUDIES PRIOR TO DISCHARGE: WBC 64.2, hemoglobin 10.0, hematocrit 29.8, platelet count 27.   Sodium 140, potassium 3.1, chloride 104, bicarbonate 26, BUN 22, creatinine 0.88, glucose 95, calcium 9.4.   Total bilirubin 1.5, ALT 20, AST 54, alkaline phosphatase 102, albumin 1.8.   CT of the head without contrast showed no acute abnormality.   Coombs test negative. LDH slightly elevated at 343. Troponin 0.42 on admission.  Blood cultures remained negative.   CT of the chest, abdomen, and pelvis showed trace left pleural effusion, extensive metastatic disease, and  pericardial effusion is present. No bowel obstruction or perforation is seen. Since lack of contrast, metastatic disease in the liver is not well demonstrated. Third spacing of fluid and ascites are present. No discrete infiltrate, edema, or mass in the lungs, and no pneumothorax.   BRIEF HOSPITAL COURSE: Vicki Lopez is a 64 year old African American female with past medical history significant for stage IV metastatic pancreatic cancer with mets to liver diagnosed in August 2012 started on palliative chemotherapy with last chemotherapy being in March 2013 with no significant improvement. She has been recently hospitalized a few times for pneumonia and has been on antibiotics persistently. Her last discharge was on 03/16/2012. She was discharged home and brought back secondary to worsening weakness, abdominal pain, and low-grade fever of 100.3 degrees Fahrenheit.  1. Persistent low-grade fevers: She was readmitted for pneumonia, but the initial thought process was less likely to be pneumonia secondary to being on antibiotics continuously for the last three months for low grade fevers. Likely underlying cause of her fever is her underlying malignancy itself. She was initially covered with broad-spectrum antibiotics and then those have been stopped since she has been afebrile and cultures are negative and the likely cause of her fever is her underlying malignancy. CT of the chest did not demonstrate any infiltrates.  2. Persistent leukocytosis, likely leukemoid reaction, stress reaction, versus secondary to steroids and underlying bone marrow stimulation from metastatic malignancy, per Dr. Lorre Nick. Her past counts have been around 40 to 45,000.  3. Metastatic pancreatic cancer with liver metastasis with recent CT of the abdomen done as an outpatient showing decrease in  pancreatic mass, but worsening liver metastasis. She  also has trouble with  maintaining her platelets and also hemoglobin level. With her worsening  functional status, palliative care has seen the patient in the hospital and after discussion with family and also Dr. Lorre NickGittin, Dr. Lorre NickGittin advised against any further chemotherapy because of her poor clinical condition and also worsening labs. So the family has decided to go with hospice and they wanted to take the patient home with hospice.  4. Anemia and thrombocytopenia: She was transfusion dependent prior to admission. She did also receive 1 unit of packed RBC transfusion in the hospital with improved blood counts, but her platelets were dropping down. No further platelet transfusion is being given secondary to the patient being transferred to hospice.  5. Oral thrush: The patient is on nystatin.  6. Elevated troponins on admission secondary to demand ischemia, less likely to be cardiac. Given her anemia history, she was not put on any IV heparin and did not have any chest pain complaints.  7. Chronic abdominal pain secondary to underlying malignancy. Pain regimen was adjusted and the patient felt comfortable. She will be discharged on a Fentanyl patch and also Roxicodone liquid p.r.n. She is also on Ativan p.r.n. for anxiety.  8. CODE STATUS: DO NOT RESUSCITATE, after palliative care discussion with the family kind.   DISCHARGE CONDITION: Very poor guarded prognosis.              DISCHARGE DISPOSITION: Home with hospice.  TIME SPENT ON DISCHARGE: 45 minutes.  ____________________________ Enid Baasadhika Doak Mah, MD rk:slb D: 03/29/2012 13:25:01 ET T: 03/30/2012 13:29:03 ET JOB#: [id number]  cc: Enid Baasadhika Milca Sytsma, MD, <Dictator> Leo GrosserNancy J. Maloney, MD Knute Neuobert G. Lorre NickGittin, MD Enid BaasADHIKA Aleyda Gindlesperger MD ELECTRONICALLY SIGNED 04/03/2012 13:43

## 2015-04-18 NOTE — H&P (Signed)
PATIENT NAME:  Vicki Lopez, Vicki Lopez MR#:  [id number] DATE OF BIRTH:  [birth year]  DATE OF ADMISSION:  03/17/2012  REFERRING PHYSICIAN: Glennie IsleSheryl Gottlieb, MD  PRIMARY CARE PHYSICIAN: Lorie PhenixNancy Maloney, MD  REASON FOR ADMISSION: Abdominal pain with fever.   HISTORY OF PRESENT ILLNESS: The patient is a 64 year old female followed by Dr. Elease HashimotoMaloney. The patient has a history of metastatic pancreatic cancer and Coombs' positive hemolytic anemia, followed by Dr. Janese BanksSandeep Pandit. She was hospitalized from 03/13/2012 until yesterday for abdominal pain and symptomatic hemolytic anemia. She re-presents to the Emergency Room one day after discharge with high fevers, weakness, and abdominal pain. In the Emergency Room, the patient was noted to be febrile with a temperature of 103.6 and a white count of greater than 40,000. No obvious source was determined in the Emergency Room and she is now admitted for further evaluation.   PAST MEDICAL HISTORY:  1. Metastatic pancreatic cancer.  2. Coombs' positive hemolytic anemia.  3. Benign hypertension.  4. Depression.  5. Sleep apnea.  6. Environmental allergies.  7. Asthma.  8. Status post gastric bypass surgery.   ADMISSION MEDICATIONS:  1. Oxycodone 5 mg p.o. every four hours p.r.n. pain.  2. Lactulose 15 mL p.o. every six hours p.r.n. constipation.  3. K-Dur 15 milliequivalents p.o. daily. 4. Xanax 0.5 mg p.o. three times daily p.r.n. anxiety.  5. Nystatin 10 mL four times daily p.r.n. thrush.  6. Zestoretic 20/12.5 mg one p.o. daily.  7. Zofran 4 mg p.o. every six hours p.r.n.  8. Iron sulfate 325 mg p.o. daily.  9. Prednisone taper as directed.  10. Folic acid 1 mg p.o. daily.  11. Zantac 150 mg p.o. twice a day.  ALLERGIES: Sulfa.   SOCIAL HISTORY: Negative for alcohol or tobacco abuse.   FAMILY HISTORY: Noncontributory.   REVIEW OF SYSTEMS: CONSTITUTIONAL: Positive fever but no change in weight. EYES: No blurred or double vision. No glaucoma. ENT: No  tinnitus or hearing loss. No nasal discharge or bleeding. No difficulty swallowing. RESPIRATORY: No cough or wheezing. No hemoptysis. CARDIOVASCULAR: No chest pain or orthopnea. No palpitations or syncope. GI: No nausea, vomiting, or diarrhea. No change in bowel habits. GU: No dysuria or hematuria. No incontinence. ENDOCRINE: No polyuria or polydipsia. No heat or cold intolerance. HEMATOLOGIC: The patient does have anemia but denies easy bruising. No bleeding. LYMPHATIC: No swollen glands. MUSCULOSKELETAL: The patient has diffuse pain. Denies gout. NEUROLOGIC: No numbness. Has weakness. Denies migraines, stroke, or seizures. PSYCH: The patient denies anxiety, insomnia, or depression.   PHYSICAL EXAMINATION:   GENERAL: The patient is acutely ill-appearing, somewhat lethargic, in moderate distress.   VITAL SIGNS: Vital signs are remarkable for a blood pressure of 146/100 with a heart rate of 131 and a respiratory rate of 22. Temperature is 103.6.   HEENT: Normocephalic, atraumatic. Pupils equally round and reactive to light and accommodation. Extraocular movements are intact. Sclerae anicteric. Conjunctivae are clear. Oropharynx is dry but clear.   NECK: Supple without jugular venous distention or bruits. No adenopathy or thyromegaly is noted.   LUNGS: Clear to auscultation and percussion without wheezes, rales, or rhonchi. No dullness.   CARDIAC: Rapid rate with a regular rhythm. Normal S1 and S2. No significant rubs or gallops.   ABDOMEN: Abdomen is tender, especially in the right upper quadrant and epigastrium. No rebound or guarding. No obvious masses. Bowel sounds were present. No hernias or bruits were noted.   EXTREMITIES: No clubbing, cyanosis, or edema. Pulses were 2+ bilaterally.  SKIN: Warm and dry without rash or lesions.   NEUROLOGIC: Cranial nerves II through XII grossly intact. Deep tendon reflexes were symmetric. Motor and sensory examination is nonfocal.   PSYCH: The patient  was alert and oriented to person, place, and time. She was cooperative and used good judgment.   LABS/STUDIES: EKG revealed sinus tachycardia with no acute ischemic changes.   Chest x-ray was nondiagnostic.   Urinalysis showed 1+ bacteria with 2 WBCs per high-power field and negative leukocyte esterase.   Lipase was 50. Troponin was less than 0.02. White count was 45.8 with a hemoglobin of 7.6. Glucose was 101 with a BUN of 7 and a creatinine of 0.67 with a sodium of 135 and a potassium of 3.9.   ASSESSMENT:  1. Fever of unknown origin.  2. Metastatic pancreatic cancer. 3. Abdominal pain.  4. Hemolytic anemia.  5. Benign hypertension.  6. Tachycardia.   PLAN: The patient will be admitted to the oncology floor with IV fluids and IV antibiotics. We will send off blood and urine cultures at this time. We will continue her outpatient regimen at this time. We will use Zofran for nausea and morphine for pain. We will consult oncology in regards to her fever, abdominal pain, hemolytic anemia, and metastatic pancreatic cancer. We will obtain an abdominal/pelvic CT in the morning as well as a PA and lateral chest x-ray. Further treatment and evaluation will depend upon the patient's progress.   TOTAL TIME SPENT: 50 minutes.  ____________________________ Duane Lope Judithann Sheen, MD jds:slb D: 03/17/2012 22:28:18 ET T: 03/18/2012 07:34:46 ET JOB#: [id number]  cc: Duane Lope. Judithann Sheen, MD, <Dictator> Leo Grosser, MD Aser Nylund Rodena Medin MD ELECTRONICALLY SIGNED 03/18/2012 16:48

## 2015-04-18 NOTE — Consult Note (Signed)
PATIENT NAME:  Vicki Lopez, Vicki Lopez MR#:  [id number] DATE OF BIRTH:  1951/03/24  DATE OF CONSULTATION:  03/18/2012  REFERRING PHYSICIAN:   CONSULTING PHYSICIAN:  Knute Neuobert G. Gittin, MD  HISTORY OF PRESENT ILLNESS: Ms. 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 She was hospitalized again last night with increasing abdominal pain and fever. Fever is a new complaint, although there has been leukocytosis. The patient is given IV fluid support and analgesics including oxycodone and IV morphine p.r.n. Worsening anemia was noted with hemoglobin down as low as 6.8. Persistent Coombs positive. Some persistent dark urine but becoming lighter and less discolored. There is no current nausea, vomiting, or diarrhea. The patient's p.o. intake is down and her appetite is down. She has not had productive cough and has not had dysuria. She has not had any skin lesions or stomatitis. CT scan of the abdomen and pelvis performed and chest x-ray shows no signs of signs of infection, it shows persistent mass in the liver and pancreas really unchanged from a few days earlier and again shows that the liver disease is increasing from other prior scan signaling progressive cancer. The patient has been cultured and cultures at this point are negative, given fluid support as well as vancomycin and Zosyn empirically.   ADDITIONAL SYMPTOM REVIEW: When seen earlier today, the patient did not have any headache or dizziness and was not having chills or sweats. Again no cough, no productive cough. No epistaxis. No ear or jaw pain. No palpitations. Retrosternal chest pain. No vomiting. Intermittent nausea. Pain is better controlled now, but was very severe last night, persistent. No diarrhea. No dysuria. No lower extremity swelling. No focal weakness. No bone or back  pain. No rash or bruising.   PAST MEDICAL HISTORY:  1. Hypertension.  2. Sleep apnea. 3. Depression. 4. Allergies. 5. Asthma. 6. Gastric bypass surgery. 7. Laparotomy and enteroclysis.   FAMILY HISTORY: Noncontributory including colon cancer.   SOCIAL HISTORY: Negative for alcohol or tobacco.   ALLERGIES: Sulfa drugs.   MEDICATIONS:  1. Recently oxycodone 5 mg every two hours p.r.n.  2. Lactulose p.r.n. for constipation.  3. Potassium liquid 20 mEq in 15 mL daily four days a week and twice a day  three days a week. 4. Xanax 0.5 mg three times daily p.r.n.  5. Phenergan 25 mg every six hours p.r.n.  6. Zofran 4 mg every six hours p.r.n. 7. Timolol eye drops 0.5% to each eye twice a day.  8. Oral iron 1 tablet daily.  9. Multivitamin daily.  10. Hydrochlorothiazide 12.5 mg. 11. Lisinopril 20 mg twice a day.  PHYSICAL EXAMINATION:   GENERAL: Alert, cooperative, and slightly weak chronically but not acutely ill-appearing.   HEENT: No jaundice of the sclerae. No thrush in the mouth.  NECK: No adenopathy in the neck, subclavicular, submandibular, or axillae.   LUNGS: Clear. No wheezing or rales.   ABDOMEN: Soft and nontender. Has had epigastrium and upper quadrant tenderness in the past. No palpable mass.   HEART: Regular.   EXTREMITIES: There is no extremity edema. There is no calf tenderness.   NEUROLOGIC: Grossly nonfocal. Moving all extremities. Alert, cooperative, and oriented. I did not test the gait.   VITALS: Temperature was down today from admission. Temperature was 101.5 last night and was 98.7 this morning. Blood pressure is normal.  LABORATORY RESULTS/RADIOLOGIC STUDIES:  Admission CPK was low. Creatinine was 0.67, potassium 3.9, and glucose 101. Liver functions were normal. Hemoglobin was 7.6, white count was up to 45,000, platelets were 192, and MCV is 88. Troponin was low. Lipase was low.   Chest x-ray showed no acute changes, some slight cardiomegaly.    CT of the abdomen and pelvis have already been described.  The urinalysis is unremarkable with 1+ blood, 1+ bacteria, and nitrite and esterase negative.   Also, not from this hospitalization but other notable recent results stated prior CT and current CT show progression of liver disease from previously. Hemoglobin is declined from 10.7 in December to recently 9.0 and then more rapidly declined. CA-19-9 has increased significantly up to 570. In the past, the patient was iron deficient and has had iron deficiency. She does not have recent serum iron studies. Recent haptoglobin was normal. LDH has been unremarkable. PT and PTT are both minimally prolonged consistent with liver disease. An occult blood x1 is negative and then ferritin was just checked 03/14/2012 and was up at 1800 and the iron saturation was 31%.   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 Failed FOLFIRINOX. I have been contemplating Abraxane and Gemcitabine salvage when stable. Recently hemoglobin down more rapidly, urine discolored, and Coombs positive, although the bilirubin, LDH, and haptoglobin were never consistent with brisk hemolysis. The patient has a blunted marrow and did not have a good reticulocyte response. Hemoglobin continues to decline but no evidence of gross bleeding. Now leukocytosis that could be infectious or due to bone marrow liver metastasis or stimulated bone marrow. Most recently now fever, possibly infectious versus inflammatory related to tumor. Agree with antibiotics with vancomycin and Zosyn. Waiting for cultures. I have discussed and planned transfusion today,       cautiously watch the response, and watch for accelerated hemolysis. Consider if the patient would get a second unit of blood later. Monitoring of the cultures and the fever curve and treating appropriately, and looking at appropriate time to begin salvage  chemotherapy. ____________________________ Knute Neu. Lorre Nick, MD rgg:slb D: 03/18/2012 23:46:07 ET T: 03/19/2012 10:59:29 ET JOB#: [id number]  cc: Knute Neu. Lorre Nick, MD, <Dictator> Marin Roberts MD ELECTRONICALLY SIGNED 03/29/2012 17:12

## 2015-04-18 NOTE — Consult Note (Signed)
    Comments   Several visits made to try to meet with family. Awaiting arrival of one of pt's HCPOA's who was coming from [place]. She is now here but she says that son is also on his way and will be here in several hrs. She would like for him to be present for mtg. Will either meet later this afternoon but more likely in AM.  has received 16mg  IV morphine over past 24hrs. Will increase fentanyl from 25mcg to 37mcg.   Electronic Signatures: Itzae Miralles, Harriett SineNancy (MD)  (Signed 02-Apr-13 15:11)  Authored: Palliative Care   Last Updated: 02-Apr-13 15:11 by Jung Yurchak, Harriett SineNancy (MD)

## 2015-04-18 NOTE — Op Note (Signed)
PATIENT NAME:  Vicki Lopez, Vicki Lopez MR#:  [id number] DATE OF BIRTH:  1951/01/23  DATE OF PROCEDURE:  12/10/2011  PREOPERATIVE DIAGNOSIS: Small intestinal obstruction.   POSTOPERATIVE DIAGNOSIS: Small intestinal obstruction.  PROCEDURE: Laparotomy with enteroclysis.   SURGEON: Adella HareJ. Wilton Smith, MD   ANESTHESIA: General.   INDICATIONS: This 64 year old female came in with abdominal distention and pain, nausea and vomiting. She had x-ray consistent with small intestinal obstruction. NG tube drained bile. Physical findings of abdominal distention, hyperactive bowel sounds, tympany. She is felt to progress during a period of observation and was brought for definitive operative procedure.  The patient is also known to have pancreatic cancer with liver metastasis.   DESCRIPTION OF PROCEDURE: The patient was placed on the operating table in the supine position under general endotracheal anesthesia. The abdomen was prepared with ChloraPrep and draped in a sterile manner.   A lower abdominal midline incision was made at the site of an old scar, carried down through subcutaneous tissues. Numerous small bleeding points were cauterized and the peritoneal cavity was opened. There was a moderate amount of ascites which was a clear straw-colored fluid. Next, multiple loops of dilated small bowel were noted. The small bowel was pulled up out of the abdomen and followed. It was markedly distended, few areas of ecchymosis. There was an adhesion in the ileum which was lysed allowing this portion of the ileum to come up out of the abdomen and then with further dissection there was a finding of a tight band of scar tissue causing a closed loop obstruction so that it was obstructing the terminal ileum at two points just about five inches apart. This band was lysed with the scissors releasing the obstruction. The two sites were examined and there appeared to be no necrosis. The lowermost point was just about 10 inches proximal  to the ileocecal valve. The cecum itself was decompressed. Next, some of the dilated small bowel was milked in a downward direction so that intestinal contents flowed through the two points that had been obstructed and flowed into the colon demonstrating resolution of the obstruction. It was also noted that bowel was followed proximally approximately 8 feet seeing no other adhesions. The bowel was returned to the abdominal cavity. Hemostasis appeared to be intact. There was no omentum found to bring beneath the wound. The incision was in the lower part of the abdomen so could not reach up as high as the liver. Subsequently, the fascia was closed with interrupted 0 Maxon figure-of-eight sutures. Skin was closed with clips and dressings were applied with paper tape.   The patient tolerated the procedure satisfactorily. It is noted that she did vomit up bile during the procedure and the anesthetist suctioned bile out of her throat and nose and also continued the nasogastric suction. The patient is now being prepared for transfer to the recovery room.   ____________________________ Shela CommonsJ. Renda RollsWilton Smith, MD jws:drc D: 12/10/2011 12:45:13 ET T: 12/11/2011 09:09:40 ET JOB#: [id number]  cc: Adella HareJ. Wilton Smith, MD, <Dictator> Adella HareWILTON J SMITH MD ELECTRONICALLY SIGNED 01/07/2012 9:04
